# Patient Record
Sex: Male | Born: 1970
Health system: Southern US, Community
[De-identification: ages and names within clinical notes are randomized; demographics above are authoritative.]

## PROBLEM LIST (undated history)

## (undated) DIAGNOSIS — I10 Essential (primary) hypertension: Secondary | ICD-10-CM

## (undated) DIAGNOSIS — F419 Anxiety disorder, unspecified: Secondary | ICD-10-CM

## (undated) HISTORY — PX: TONSILLECTOMY: SUR1361

## (undated) HISTORY — PX: APPENDECTOMY: SHX54

---

## 2009-12-10 ENCOUNTER — Ambulatory Visit: Payer: Self-pay | Admitting: Cardiology

## 2009-12-10 ENCOUNTER — Observation Stay (HOSPITAL_COMMUNITY): Admission: EM | Admit: 2009-12-10 | Discharge: 2009-12-11 | Payer: Self-pay | Admitting: Emergency Medicine

## 2009-12-11 ENCOUNTER — Encounter (INDEPENDENT_AMBULATORY_CARE_PROVIDER_SITE_OTHER): Payer: Self-pay | Admitting: *Deleted

## 2009-12-11 LAB — CONVERTED CEMR LAB
ALT: 31 units/L
AST: 23 units/L
Albumin: 3.6 g/dL
Alkaline Phosphatase: 40 units/L
BUN: 18 mg/dL
Basophils Absolute: 0 10*3/uL
Basophils Relative: 1 %
CO2: 27 meq/L
Calcium: 8.9 mg/dL
Chloride: 104 meq/L
Creatinine, Ser: 1.08 mg/dL
Eosinophils Absolute: 0.2 10*3/uL
Eosinophils Relative: 2 %
GFR calc non Af Amer: >60 mL/min
Glomerular Filtration Rate, Af Am: >60 mL/min/{1.73_m2}
Glucose, Bld: 96 mg/dL
HCT: 39.8 %
Hemoglobin: 13.8 g/dL
Lymphocytes Relative: 38 %
Lymphs Abs: 2.5 10*3/uL
MCHC: 34.7 g/dL
MCV: 90.3 fL
Monocytes Absolute: 0.5 10*3/uL
Monocytes Relative: 7 %
Neutro Abs: 3.5 10*3/uL
Platelets: 263 10*3/uL
Potassium: 3.8 meq/L
RBC: 4.41 M/uL
RDW: 13 %
Sodium: 139 meq/L
Total Protein: 6.4 g/dL
WBC: 6.7 10*3/uL

## 2009-12-15 ENCOUNTER — Ambulatory Visit: Payer: Self-pay | Admitting: Cardiology

## 2009-12-15 ENCOUNTER — Ambulatory Visit (HOSPITAL_COMMUNITY): Admission: RE | Admit: 2009-12-15 | Discharge: 2009-12-15 | Payer: Self-pay | Admitting: Cardiology

## 2009-12-15 ENCOUNTER — Encounter: Payer: Self-pay | Admitting: Cardiology

## 2010-01-08 ENCOUNTER — Encounter (INDEPENDENT_AMBULATORY_CARE_PROVIDER_SITE_OTHER): Payer: Self-pay | Admitting: *Deleted

## 2010-01-15 ENCOUNTER — Encounter (INDEPENDENT_AMBULATORY_CARE_PROVIDER_SITE_OTHER): Payer: Self-pay | Admitting: *Deleted

## 2010-01-15 ENCOUNTER — Encounter: Payer: Self-pay | Admitting: Cardiology

## 2010-01-15 DIAGNOSIS — R079 Chest pain, unspecified: Secondary | ICD-10-CM | POA: Insufficient documentation

## 2010-01-15 DIAGNOSIS — E669 Obesity, unspecified: Secondary | ICD-10-CM | POA: Insufficient documentation

## 2010-01-15 DIAGNOSIS — K219 Gastro-esophageal reflux disease without esophagitis: Secondary | ICD-10-CM | POA: Insufficient documentation

## 2010-01-15 DIAGNOSIS — F1010 Alcohol abuse, uncomplicated: Secondary | ICD-10-CM | POA: Insufficient documentation

## 2010-01-15 DIAGNOSIS — F17200 Nicotine dependence, unspecified, uncomplicated: Secondary | ICD-10-CM | POA: Insufficient documentation

## 2010-04-14 NOTE — Miscellaneous (Signed)
Summary: NPH    Allergies: No Known Drug Allergies  Past History:  Past Medical History: Atypical chest pain-negative stress echocardiogram Alcohol abuse Hypertension Tobacco abuse Obesity Gastroesophageal reflux disease  Past Surgical History: Appendectomy Tonsillectomy Retrieval of ingested foreign body from stomach-2000  Family History: Mother-hypertension Father-hypertension No family history for premature coronary artery disease  Social History: Employment-truck Product manager Married; one child Tobacco Use - 15 pack years Alcohol Use - 6+ ounces per day Regular Exercise - no Drug Use - no

## 2010-04-14 NOTE — Letter (Signed)
Summary: Appointment - Missed  [facility] [place]  [address] [place] [address]   Phone: [phone]  Fax: [phone]     January 15, 2010 MRN: [id number]   [facility] [address] [place], [place]  [address]   Dear Mr. Strickling,  Our records indicate you missed your appointment on     01/15/10                   with Dr.       .      ROTHBART                             It is very important that we reach you to reschedule this appointment. We look forward to participating in your health care needs. Please contact us at the number listed above at your earliest convenience to reschedule this appointment.     Sincerely,    Glass blower/designer

## 2010-04-14 NOTE — Miscellaneous (Signed)
Summary: labs hospital 12/11/2009  Clinical Lists Changes  Observations: Added new observation of CALCIUM: 8.9 mg/dL (11/91/4782 95:62) Added new observation of ALBUMIN: 3.6 g/dL (13/10/6576 46:96) Added new observation of PROTEIN, TOT: 6.4 g/dL (29/52/8413 24:40) Added new observation of SGPT (ALT): 31 units/L (12/11/2009 11:00) Added new observation of SGOT (AST): 23 units/L (12/11/2009 11:00) Added new observation of ALK PHOS: 40 units/L (12/11/2009 11:00) Added new observation of GFR AA: >60 mL/min/1.6m2 (12/11/2009 11:00) Added new observation of GFR: >60 mL/min (12/11/2009 11:00) Added new observation of CREATININE: 1.08 mg/dL (01/09/2535 64:40) Added new observation of BUN: 18 mg/dL (34/74/2595 63:87) Added new observation of BG RANDOM: 96 mg/dL (56/43/3295 18:84) Added new observation of CO2 PLSM/SER: 27 meq/L (12/11/2009 11:00) Added new observation of CL SERUM: 104 meq/L (12/11/2009 11:00) Added new observation of K SERUM: 3.8 meq/L (12/11/2009 11:00) Added new observation of NA: 139 meq/L (12/11/2009 11:00) Added new observation of ABSOLUTE BAS: 0.0 K/uL (12/11/2009 11:00) Added new observation of BASOPHIL %: 1 % (12/11/2009 11:00) Added new observation of EOS ABSLT: 0.2 K/uL (12/11/2009 11:00) Added new observation of % EOS AUTO: 2 % (12/11/2009 11:00) Added new observation of ABSOLUTE MON: 0.5 K/uL (12/11/2009 11:00) Added new observation of MONOCYTE %: 7 % (12/11/2009 11:00) Added new observation of ABS LYMPHOCY: 2.5 K/uL (12/11/2009 11:00) Added new observation of LYMPHS %: 38 % (12/11/2009 11:00) Added new observation of ABS NEUTROPH: 3.5 K/uL (12/11/2009 11:00) Added new observation of PLATELETK/UL: 263 K/uL (12/11/2009 11:00) Added new observation of RDW: 13.0 % (12/11/2009 11:00) Added new observation of MCHC RBC: 34.7 g/dL (16/60/6301 60:10) Added new observation of MCV: 90.3 fL (12/11/2009 11:00) Added new observation of HCT: 39.8 % (12/11/2009 11:00) Added new  observation of HGB: 13.8 g/dL (93/23/5573 22:02) Added new observation of RBC M/UL: 4.41 M/uL (12/11/2009 11:00) Added new observation of WBC COUNT: 6.7 10*3/microliter (12/11/2009 11:00)

## 2010-05-28 LAB — COMPREHENSIVE METABOLIC PANEL
ALT: 31 U/L (ref 0–53)
AST: 23 U/L (ref 0–37)
Albumin: 3.6 g/dL (ref 3.5–5.2)
Alkaline Phosphatase: 40 U/L (ref 39–117)
BUN: 18 mg/dL (ref 6–23)
CO2: 27 mEq/L (ref 19–32)
Calcium: 8.9 mg/dL (ref 8.4–10.5)
Chloride: 104 mEq/L (ref 96–112)
Creatinine, Ser: 1.08 mg/dL (ref 0.4–1.5)
GFR calc Af Amer: >60 mL/min (ref >60)
GFR calc non Af Amer: >60 mL/min (ref >60)
Glucose, Bld: 96 mg/dL (ref 70–99)
Potassium: 3.8 mEq/L (ref 3.5–5.1)
Sodium: 139 mEq/L (ref 135–145)
Total Bilirubin: 0.5 mg/dL (ref 0.3–1.2)
Total Protein: 6.4 g/dL (ref 6.0–8.3)

## 2010-05-28 LAB — LIPID PANEL
Cholesterol: 189 mg/dL (ref 0–200)
HDL: 30 mg/dL — ABNORMAL LOW (ref >39)
LDL Cholesterol: 111 mg/dL — ABNORMAL HIGH (ref 0–99)
Total CHOL/HDL Ratio: 6.3 RATIO
Triglycerides: 241 mg/dL — ABNORMAL HIGH (ref <150)
VLDL: 48 mg/dL — ABNORMAL HIGH (ref 0–40)

## 2010-05-28 LAB — DIFFERENTIAL
Basophils Absolute: 0 10*3/uL (ref 0.0–0.1)
Basophils Absolute: 0 10*3/uL (ref 0.0–0.1)
Basophils Relative: 1 % (ref 0–1)
Basophils Relative: 1 % (ref 0–1)
Eosinophils Absolute: 0.1 10*3/uL (ref 0.0–0.7)
Eosinophils Absolute: 0.2 10*3/uL (ref 0.0–0.7)
Eosinophils Relative: 2 % (ref 0–5)
Eosinophils Relative: 2 % (ref 0–5)
Lymphocytes Relative: 32 % (ref 12–46)
Lymphocytes Relative: 38 % (ref 12–46)
Lymphs Abs: 1.8 10*3/uL (ref 0.7–4.0)
Lymphs Abs: 2.5 10*3/uL (ref 0.7–4.0)
Monocytes Absolute: 0.4 10*3/uL (ref 0.1–1.0)
Monocytes Absolute: 0.5 10*3/uL (ref 0.1–1.0)
Monocytes Relative: 7 % (ref 3–12)
Monocytes Relative: 7 % (ref 3–12)
Neutro Abs: 3.3 10*3/uL (ref 1.7–7.7)
Neutro Abs: 3.5 10*3/uL (ref 1.7–7.7)
Neutrophils Relative %: 52 % (ref 43–77)
Neutrophils Relative %: 58 % (ref 43–77)

## 2010-05-28 LAB — CBC
HCT: 39.8 % (ref 39.0–52.0)
HCT: 41.5 % (ref 39.0–52.0)
Hemoglobin: 13.8 g/dL (ref 13.0–17.0)
Hemoglobin: 14.5 g/dL (ref 13.0–17.0)
MCH: 31.3 pg (ref 26.0–34.0)
MCH: 31.5 pg (ref 26.0–34.0)
MCHC: 34.7 g/dL (ref 30.0–36.0)
MCHC: 35 g/dL (ref 30.0–36.0)
MCV: 89.9 fL (ref 78.0–100.0)
MCV: 90.3 fL (ref 78.0–100.0)
Platelets: 263 10*3/uL (ref 150–400)
Platelets: 272 10*3/uL (ref 150–400)
RBC: 4.41 MIL/uL (ref 4.22–5.81)
RBC: 4.61 MIL/uL (ref 4.22–5.81)
RDW: 13 % (ref 11.5–15.5)
RDW: 13.2 % (ref 11.5–15.5)
WBC: 5.7 10*3/uL (ref 4.0–10.5)
WBC: 6.7 10*3/uL (ref 4.0–10.5)

## 2010-05-28 LAB — CARDIAC PANEL(CRET KIN+CKTOT+MB+TROPI)
CK, MB: 0.4 ng/mL (ref 0.3–4.0)
CK, MB: 0.5 ng/mL (ref 0.3–4.0)
CK, MB: 0.6 ng/mL (ref 0.3–4.0)
Relative Index: INVALID (ref 0.0–2.5)
Relative Index: INVALID (ref 0.0–2.5)
Relative Index: INVALID (ref 0.0–2.5)
Total CK: 34 U/L (ref 7–232)
Total CK: 36 U/L (ref 7–232)
Total CK: 37 U/L (ref 7–232)
Troponin I: 0.01 ng/mL (ref 0.00–0.06)
Troponin I: <0.01 ng/mL (ref 0.00–0.06)
Troponin I: <0.01 ng/mL (ref 0.00–0.06)

## 2010-05-28 LAB — POCT CARDIAC MARKERS
CKMB, poc: <1 ng/mL — ABNORMAL LOW (ref 1.0–8.0)
CKMB, poc: <1 ng/mL — ABNORMAL LOW (ref 1.0–8.0)
Myoglobin, poc: 30.9 ng/mL (ref 12–200)
Myoglobin, poc: 32 ng/mL (ref 12–200)
Troponin i, poc: <0.05 ng/mL (ref 0.00–0.09)
Troponin i, poc: <0.05 ng/mL (ref 0.00–0.09)

## 2010-05-28 LAB — BASIC METABOLIC PANEL
BUN: 16 mg/dL (ref 6–23)
CO2: 28 mEq/L (ref 19–32)
Calcium: 9.1 mg/dL (ref 8.4–10.5)
Chloride: 103 mEq/L (ref 96–112)
Creatinine, Ser: 0.98 mg/dL (ref 0.4–1.5)
GFR calc Af Amer: >60 mL/min (ref >60)
GFR calc non Af Amer: >60 mL/min (ref >60)
Glucose, Bld: 94 mg/dL (ref 70–99)
Potassium: 4 mEq/L (ref 3.5–5.1)
Sodium: 137 mEq/L (ref 135–145)

## 2010-05-28 LAB — D-DIMER, QUANTITATIVE: D-Dimer, Quant: 0.26 ug/mL-FEU (ref 0.00–0.48)

## 2010-05-28 LAB — APTT: aPTT: 37 seconds (ref 24–37)

## 2010-05-28 LAB — BRAIN NATRIURETIC PEPTIDE: Pro B Natriuretic peptide (BNP): <30 pg/mL (ref 0.0–100.0)

## 2010-05-28 LAB — PROTIME-INR
INR: 0.99 (ref 0.00–1.49)
Prothrombin Time: 13.3 seconds (ref 11.6–15.2)

## 2010-05-28 LAB — TSH: TSH: 1.071 u[IU]/mL (ref 0.350–4.500)

## 2011-11-24 ENCOUNTER — Other Ambulatory Visit (HOSPITAL_COMMUNITY): Payer: Self-pay | Admitting: Internal Medicine

## 2011-11-24 DIAGNOSIS — M5430 Sciatica, unspecified side: Secondary | ICD-10-CM

## 2011-11-24 DIAGNOSIS — M545 Low back pain, unspecified: Secondary | ICD-10-CM

## 2011-11-26 ENCOUNTER — Inpatient Hospital Stay (HOSPITAL_COMMUNITY): Admission: RE | Admit: 2011-11-26 | Payer: Self-pay | Source: Ambulatory Visit

## 2013-07-17 ENCOUNTER — Other Ambulatory Visit: Payer: Self-pay | Admitting: Internal Medicine

## 2016-05-12 DIAGNOSIS — I10 Essential (primary) hypertension: Secondary | ICD-10-CM | POA: Diagnosis not present

## 2016-05-12 DIAGNOSIS — F419 Anxiety disorder, unspecified: Secondary | ICD-10-CM | POA: Diagnosis not present

## 2016-05-12 DIAGNOSIS — R55 Syncope and collapse: Secondary | ICD-10-CM | POA: Diagnosis not present

## 2016-05-12 DIAGNOSIS — Z1389 Encounter for screening for other disorder: Secondary | ICD-10-CM | POA: Diagnosis not present

## 2016-05-12 DIAGNOSIS — Z6825 Body mass index (BMI) 25.0-25.9, adult: Secondary | ICD-10-CM | POA: Diagnosis not present

## 2016-08-05 DIAGNOSIS — Z0000 Encounter for general adult medical examination without abnormal findings: Secondary | ICD-10-CM | POA: Diagnosis not present

## 2016-12-07 DIAGNOSIS — Z1389 Encounter for screening for other disorder: Secondary | ICD-10-CM | POA: Diagnosis not present

## 2016-12-07 DIAGNOSIS — Z6825 Body mass index (BMI) 25.0-25.9, adult: Secondary | ICD-10-CM | POA: Diagnosis not present

## 2016-12-07 DIAGNOSIS — J302 Other seasonal allergic rhinitis: Secondary | ICD-10-CM | POA: Diagnosis not present

## 2016-12-07 DIAGNOSIS — E782 Mixed hyperlipidemia: Secondary | ICD-10-CM | POA: Diagnosis not present

## 2016-12-07 DIAGNOSIS — R5383 Other fatigue: Secondary | ICD-10-CM | POA: Diagnosis not present

## 2016-12-07 DIAGNOSIS — I10 Essential (primary) hypertension: Secondary | ICD-10-CM | POA: Diagnosis not present

## 2017-06-10 DIAGNOSIS — F419 Anxiety disorder, unspecified: Secondary | ICD-10-CM | POA: Diagnosis not present

## 2017-06-10 DIAGNOSIS — K069 Disorder of gingiva and edentulous alveolar ridge, unspecified: Secondary | ICD-10-CM | POA: Diagnosis not present

## 2017-06-10 DIAGNOSIS — K056 Periodontal disease, unspecified: Secondary | ICD-10-CM | POA: Diagnosis not present

## 2017-06-10 DIAGNOSIS — H539 Unspecified visual disturbance: Secondary | ICD-10-CM | POA: Diagnosis not present

## 2017-06-10 DIAGNOSIS — Z6826 Body mass index (BMI) 26.0-26.9, adult: Secondary | ICD-10-CM | POA: Diagnosis not present

## 2017-06-10 DIAGNOSIS — Z1389 Encounter for screening for other disorder: Secondary | ICD-10-CM | POA: Diagnosis not present

## 2017-06-10 DIAGNOSIS — E663 Overweight: Secondary | ICD-10-CM | POA: Diagnosis not present

## 2017-07-21 DIAGNOSIS — Z0000 Encounter for general adult medical examination without abnormal findings: Secondary | ICD-10-CM | POA: Diagnosis not present

## 2017-10-13 DIAGNOSIS — Z713 Dietary counseling and surveillance: Secondary | ICD-10-CM | POA: Diagnosis not present

## 2017-11-10 DIAGNOSIS — E663 Overweight: Secondary | ICD-10-CM | POA: Diagnosis not present

## 2017-11-10 DIAGNOSIS — Z6826 Body mass index (BMI) 26.0-26.9, adult: Secondary | ICD-10-CM | POA: Diagnosis not present

## 2017-11-10 DIAGNOSIS — E782 Mixed hyperlipidemia: Secondary | ICD-10-CM | POA: Diagnosis not present

## 2017-11-10 DIAGNOSIS — Z719 Counseling, unspecified: Secondary | ICD-10-CM | POA: Diagnosis not present

## 2017-11-10 DIAGNOSIS — Z1389 Encounter for screening for other disorder: Secondary | ICD-10-CM | POA: Diagnosis not present

## 2017-11-10 DIAGNOSIS — Z0001 Encounter for general adult medical examination with abnormal findings: Secondary | ICD-10-CM | POA: Diagnosis not present

## 2017-12-21 DIAGNOSIS — Z713 Dietary counseling and surveillance: Secondary | ICD-10-CM | POA: Diagnosis not present

## 2018-05-04 DIAGNOSIS — Z713 Dietary counseling and surveillance: Secondary | ICD-10-CM | POA: Diagnosis not present

## 2018-05-04 DIAGNOSIS — Z13220 Encounter for screening for lipoid disorders: Secondary | ICD-10-CM | POA: Diagnosis not present

## 2018-05-23 DIAGNOSIS — Z1389 Encounter for screening for other disorder: Secondary | ICD-10-CM | POA: Diagnosis not present

## 2018-05-23 DIAGNOSIS — I10 Essential (primary) hypertension: Secondary | ICD-10-CM | POA: Diagnosis not present

## 2018-05-23 DIAGNOSIS — E7849 Other hyperlipidemia: Secondary | ICD-10-CM | POA: Diagnosis not present

## 2018-05-23 DIAGNOSIS — E663 Overweight: Secondary | ICD-10-CM | POA: Diagnosis not present

## 2018-05-23 DIAGNOSIS — Z6827 Body mass index (BMI) 27.0-27.9, adult: Secondary | ICD-10-CM | POA: Diagnosis not present

## 2018-06-07 DIAGNOSIS — I10 Essential (primary) hypertension: Secondary | ICD-10-CM | POA: Diagnosis not present

## 2018-06-07 DIAGNOSIS — E663 Overweight: Secondary | ICD-10-CM | POA: Diagnosis not present

## 2018-06-07 DIAGNOSIS — Z1389 Encounter for screening for other disorder: Secondary | ICD-10-CM | POA: Diagnosis not present

## 2018-06-07 DIAGNOSIS — Z6827 Body mass index (BMI) 27.0-27.9, adult: Secondary | ICD-10-CM | POA: Diagnosis not present

## 2018-08-21 ENCOUNTER — Emergency Department (HOSPITAL_COMMUNITY)
Admission: EM | Admit: 2018-08-21 | Discharge: 2018-08-21 | Disposition: A | Discharge status: Home / Self Care | Payer: BC Managed Care – PPO | Attending: Emergency Medicine | Admitting: Emergency Medicine

## 2018-08-21 ENCOUNTER — Other Ambulatory Visit: Payer: Self-pay

## 2018-08-21 ENCOUNTER — Encounter (HOSPITAL_COMMUNITY): Payer: Self-pay | Admitting: Emergency Medicine

## 2018-08-21 ENCOUNTER — Emergency Department (HOSPITAL_COMMUNITY): Payer: BC Managed Care – PPO

## 2018-08-21 DIAGNOSIS — K429 Umbilical hernia without obstruction or gangrene: Secondary | ICD-10-CM | POA: Diagnosis not present

## 2018-08-21 DIAGNOSIS — I10 Essential (primary) hypertension: Secondary | ICD-10-CM | POA: Diagnosis not present

## 2018-08-21 DIAGNOSIS — F17210 Nicotine dependence, cigarettes, uncomplicated: Secondary | ICD-10-CM | POA: Insufficient documentation

## 2018-08-21 DIAGNOSIS — R1084 Generalized abdominal pain: Secondary | ICD-10-CM | POA: Insufficient documentation

## 2018-08-21 DIAGNOSIS — R0789 Other chest pain: Secondary | ICD-10-CM | POA: Diagnosis not present

## 2018-08-21 DIAGNOSIS — R079 Chest pain, unspecified: Secondary | ICD-10-CM | POA: Diagnosis not present

## 2018-08-21 DIAGNOSIS — R9431 Abnormal electrocardiogram [ECG] [EKG]: Secondary | ICD-10-CM | POA: Diagnosis not present

## 2018-08-21 HISTORY — DX: Essential (primary) hypertension: I10

## 2018-08-21 LAB — BASIC METABOLIC PANEL
Anion gap: 13 (ref 5–15)
BUN: 11 mg/dL (ref 6–20)
CO2: 21 mmol/L — ABNORMAL LOW (ref 22–32)
Calcium: 9.2 mg/dL (ref 8.9–10.3)
Chloride: 102 mmol/L (ref 98–111)
Creatinine, Ser: 1.06 mg/dL (ref 0.61–1.24)
GFR calc Af Amer: >60 mL/min (ref >60)
GFR calc non Af Amer: >60 mL/min (ref >60)
Glucose, Bld: 93 mg/dL (ref 70–99)
Potassium: 4.3 mmol/L (ref 3.5–5.1)
Sodium: 136 mmol/L (ref 135–145)

## 2018-08-21 LAB — LIPASE, BLOOD: Lipase: 55 U/L — ABNORMAL HIGH (ref 11–51)

## 2018-08-21 LAB — CBC
HCT: 48.3 % (ref 39.0–52.0)
Hemoglobin: 16.2 g/dL (ref 13.0–17.0)
MCH: 30.7 pg (ref 26.0–34.0)
MCHC: 33.5 g/dL (ref 30.0–36.0)
MCV: 91.7 fL (ref 80.0–100.0)
Platelets: 329 10*3/uL (ref 150–400)
RBC: 5.27 MIL/uL (ref 4.22–5.81)
RDW: 13.5 % (ref 11.5–15.5)
WBC: 11.1 10*3/uL — ABNORMAL HIGH (ref 4.0–10.5)
nRBC: 0 % (ref 0.0–0.2)

## 2018-08-21 LAB — TROPONIN I: Troponin I: <0.03 ng/mL (ref <0.03)

## 2018-08-21 MED ORDER — SODIUM CHLORIDE 0.9% FLUSH: 3.0000 mL | Freq: Once | INTRAVENOUS | Status: DC

## 2018-08-21 MED ORDER — IOHEXOL 300 MG/ML  SOLN
100.0000 mL | Freq: Once | INTRAMUSCULAR | Status: AC | PRN
  Administered 2018-08-21: 18:00:00 100 mL via INTRAVENOUS

## 2018-08-21 MED ORDER — SODIUM CHLORIDE 0.9 % IV BOLUS
1000.0000 mL | Freq: Once | INTRAVENOUS | Status: AC
  Administered 2018-08-21: 1000 mL via INTRAVENOUS

## 2018-08-21 NOTE — ED Provider Notes (Signed)
[facility] EMERGENCY DEPARTMENT Provider Note   CSN: [id number] Arrival date & time: 08/21/18  1543    History   Chief Complaint Chief Complaint  Patient presents with   Chest Pain    HPI Derrick RavelDaniel R Knoebel is a 48 y.o. male.     Pt presents to the ED today with cp.  Pt said he works outside and had been outside all day.  He has been drinking fluids.  He said the pain lasted for a few minutes and caused his hands to get numb.  He feels ok now.  He denies any sob.  No fevers or cough.     Past Medical History:  Diagnosis Date   Hypertension     Patient Active Problem List   Diagnosis Date Noted   OBESITY 01/15/2010   ALCOHOL ABUSE 01/15/2010   TOBACCO ABUSE 01/15/2010   GASTROESOPHAGEAL REFLUX DISEASE 01/15/2010   CHEST PAIN 01/15/2010    History reviewed. No pertinent surgical history.      Home Medications    Prior to Admission medications   Medication Sig Start Date End Date Taking? Authorizing Provider  losartan (COZAAR) 100 MG tablet Take 100 mg by mouth daily.  02/21/14  Yes [provider]  metoprolol succinate (TOPROL-XL) 100 MG 24 hr tablet TAKE 1 TABLET BY MOUTH EVERY DAY 07/17/13   Cliffton Astersampbell, John, MD    Family History No family history on file.  Social History Social History   Tobacco Use   Smoking status: Current Some Day Smoker   Smokeless tobacco: Never Used  Substance Use Topics   Alcohol use: Yes   Drug use: Yes    Types: Marijuana     Allergies   Patient has no known allergies.   Review of Systems Review of Systems  Cardiovascular: Positive for chest pain.  All other systems reviewed and are negative.    Physical Exam Updated Vital Signs BP (!) 143/90 (BP Location: Right Arm)    Pulse 94    Temp 98.1 F (36.7 C) (Oral)    Resp 14    Ht 5\' 10"  (1.778 m)    Wt 80.7 kg    SpO2 98%    BMI 25.54 kg/m   Physical Exam Vitals signs and nursing note reviewed.  Constitutional:      Appearance: He is  well-developed.  HENT:     Head: Normocephalic and atraumatic.  Eyes:     Extraocular Movements: Extraocular movements intact.     Pupils: Pupils are equal, round, and reactive to light.  Neck:     Musculoskeletal: Normal range of motion and neck supple.  Cardiovascular:     Rate and Rhythm: Normal rate and regular rhythm.     Heart sounds: Normal heart sounds.  Pulmonary:     Effort: Pulmonary effort is normal.     Breath sounds: Normal breath sounds.  Abdominal:     General: Bowel sounds are normal.     Palpations: Abdomen is soft.  Musculoskeletal: Normal range of motion.  Skin:    General: Skin is warm and dry.     Capillary Refill: Capillary refill takes less than 2 seconds.  Neurological:     General: No focal deficit present.     Mental Status: He is alert and oriented to person, place, and time.  Psychiatric:        Mood and Affect: Mood normal.        Behavior: Behavior normal.      ED Treatments /  Results  Labs (all labs ordered are listed, but only abnormal results are displayed) Labs Reviewed  BASIC METABOLIC PANEL - Abnormal; Notable for the following components:      Result Value   CO2 21 (*)    All other components within normal limits  CBC - Abnormal; Notable for the following components:   WBC 11.1 (*)    All other components within normal limits  LIPASE, BLOOD - Abnormal; Notable for the following components:   Lipase 55 (*)    All other components within normal limits  TROPONIN I    EKG EKG Interpretation  Date/Time:  Monday August 21 2018 15:55:33 EDT Ventricular Rate:  99 PR Interval:  144 QRS Duration: 92 QT Interval:  340 QTC Calculation: 436 R Axis:   70 Text Interpretation:  Normal sinus rhythm Incomplete right bundle branch block Possible Anterior infarct , age undetermined Abnormal ECG Since last tracing rate faster Confirmed by Isla Pence [phone]) on 08/21/2018 4:12:06 PM   Radiology Dg Chest 2 View  Result Date:  08/21/2018 CLINICAL DATA:  48 year old male with history of chest pain radiating to the mid back. EXAM: CHEST - 2 VIEW COMPARISON:  Chest x-ray 12/10/2009. FINDINGS: Lung volumes are normal. No consolidative airspace disease. No pleural effusions. No pneumothorax. No pulmonary nodule or mass noted. Pulmonary vasculature and the cardiomediastinal silhouette are within normal limits. IMPRESSION: No radiographic evidence of acute cardiopulmonary disease. Electronically Signed   By: Vinnie Langton M.D.   On: 08/21/2018 16:48   Ct Abdomen Pelvis W Contrast  Result Date: 08/21/2018 CLINICAL DATA:  Chest and mid back pain EXAM: CT ABDOMEN AND PELVIS WITH CONTRAST TECHNIQUE: Multidetector CT imaging of the abdomen and pelvis was performed using the standard protocol following bolus administration of intravenous contrast. CONTRAST:  116mL OMNIPAQUE IOHEXOL 300 MG/ML  SOLN COMPARISON:  None. FINDINGS: Lower chest: The heart size is mildly enlarged. The lung bases are clear. Hepatobiliary: There is a patent steatosis. The gallbladder is unremarkable by CT standards. Pancreas: Unremarkable. No pancreatic ductal dilatation or surrounding inflammatory changes. Spleen: Normal in size without focal abnormality. Adrenals/Urinary Tract: Adrenal glands are unremarkable. Kidneys are normal, without renal calculi, focal lesion, or hydronephrosis. Bladder is unremarkable. Stomach/Bowel: Stomach is within normal limits. The appendix is not reliably identified. No evidence of bowel wall thickening, distention, or inflammatory changes. Vascular/Lymphatic: No significant vascular findings are present. No enlarged abdominal or pelvic lymph nodes. Reproductive: Prostate is unremarkable. Other: There are a few fat containing periumbilical hernias. There is no significant free fluid. Musculoskeletal: No acute or significant osseous findings. IMPRESSION: 1. No acute intra-abdominal abnormality detected. 2. Probable underlying hepatic  steatosis. 3. A few fat containing periumbilical hernias are noted. Electronically Signed   By: Constance Holster M.D.   On: 08/21/2018 18:58    Procedures Procedures (including critical care time)  Medications Ordered in ED Medications  sodium chloride flush (NS) 0.9 % injection 3 mL (has no administration in time range)  sodium chloride 0.9 % bolus 1,000 mL (1,000 mLs Intravenous New Bag/Given 08/21/18 1736)  iohexol (OMNIPAQUE) 300 MG/ML solution 100 mL (100 mLs Intravenous Contrast Given 08/21/18 1757)     Initial Impression / Assessment and Plan / ED Course  I have reviewed the triage vital signs and the nursing notes.  Pertinent labs & imaging results that were available during my care of the patient were reviewed by me and considered in my medical decision making (see chart for details).  Work up is unremarkable.  CP only for a few minutes.  Neg EKG and trop.  Abd/pelvis CT without anything acute.  Pt encouraged to drink lots of fluids if working outside.  Return if worse.  F/u with pcp.  Final Clinical Impressions(s) / ED Diagnoses   Final diagnoses:  Atypical chest pain  Generalized abdominal pain    ED Discharge Orders    None       Jacalyn LefevreHaviland, Param Capri, MD 08/21/18 1904

## 2018-08-21 NOTE — ED Triage Notes (Signed)
Pt C/o chest pain that radiates to the mid back. Pt states the pain started in his abdomen and "moved to his chest." Pt also reports tingling in hands and arms.

## 2018-08-29 DIAGNOSIS — R0789 Other chest pain: Secondary | ICD-10-CM | POA: Diagnosis not present

## 2018-08-29 DIAGNOSIS — Z719 Counseling, unspecified: Secondary | ICD-10-CM | POA: Diagnosis not present

## 2018-08-29 DIAGNOSIS — Z6826 Body mass index (BMI) 26.0-26.9, adult: Secondary | ICD-10-CM | POA: Diagnosis not present

## 2018-08-29 DIAGNOSIS — Z1389 Encounter for screening for other disorder: Secondary | ICD-10-CM | POA: Diagnosis not present

## 2018-08-29 DIAGNOSIS — E663 Overweight: Secondary | ICD-10-CM | POA: Diagnosis not present

## 2018-10-17 DIAGNOSIS — Z0000 Encounter for general adult medical examination without abnormal findings: Secondary | ICD-10-CM | POA: Diagnosis not present

## 2018-11-14 ENCOUNTER — Other Ambulatory Visit: Payer: Self-pay

## 2018-11-14 ENCOUNTER — Encounter: Payer: Self-pay | Admitting: Cardiovascular Disease

## 2018-11-14 ENCOUNTER — Ambulatory Visit (INDEPENDENT_AMBULATORY_CARE_PROVIDER_SITE_OTHER): Payer: BC Managed Care – PPO | Admitting: Cardiovascular Disease

## 2018-11-14 VITALS — BP 158/88 | HR 89 | Temp 79.2°F | Ht 70.0 in | Wt 175.0 lb

## 2018-11-14 DIAGNOSIS — I10 Essential (primary) hypertension: Secondary | ICD-10-CM | POA: Diagnosis not present

## 2018-11-14 DIAGNOSIS — R079 Chest pain, unspecified: Secondary | ICD-10-CM | POA: Diagnosis not present

## 2018-11-14 NOTE — Patient Instructions (Signed)
Medication Instructions: Your physician recommends that you continue on your current medications as directed. Please refer to the Current Medication list given to you today.   Labwork: None  Procedures/Testing: None  Follow-Up: As needed with Dr.koneswaran  Any Additional Special Instructions Will Be Listed Below (If Applicable).    Thank you for choosing [facility] !

## 2018-11-14 NOTE — Progress Notes (Signed)
CARDIOLOGY CONSULT NOTE  Patient ID: Wesley Dunn MRN: [id number] DOB/AGE: 1970/08/12 48 y.o.  Admit date: (Not on file) Primary Physician: Sharilyn Sites, MD  Reason for Consultation: Chest pain  HPI: Wesley Dunn is a 48 y.o. male who is being seen today for the evaluation of chest pain at the request of Sharilyn Sites, MD.   He was evaluated for ED in the chest pain on 08/21/2018.  He had some associated bilateral hand numbness.  There is no associated shortness of breath, fevers, or cough.  He had been working outside all day and it was felt that he had atypical chest pain from relative dehydration.  White blood cells were mildly elevated at 11.1.  Troponins were normal.  Renal function was also normal.  Chest x-ray showed no acute disease.  He had some abdominal pain and a CT abdomen was performed which showed no acute abnormalities.  There was probable hepatic steatosis.  I personally reviewed ECG which demonstrated sinus rhythm, 99 bpm, RSR prime pattern, and late R wave transition.  Since the episode in early June he has had no further episodes of chest pain.  He also denies shortness of breath, palpitations, leg swelling, orthopnea, paroxysmal nocturnal dyspnea.  He has occasional dizziness but denies syncope.  He checks his blood pressure regularly and said it generally runs in the 130/80 range.  He said he gets very nervous when coming to a hospital or doctors offices.  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  He is married.  He has a 33 year old son and a 2 year old daughter.  He used to be an alcoholic and drinks 4-5 cases of beer per week and now drinks 12 cans of beer per week at a maximum and wants to quit altogether.  He smokes about 6 cigars a day and used to smoke 12 cigars a day.  He requests a letter to be off from work today.   No Known Allergies  Current Outpatient Medications  Medication Sig Dispense Refill  . losartan (COZAAR) 100 MG  tablet Take 100 mg by mouth daily.      No current facility-administered medications for this visit.     Past Medical History:  Diagnosis Date  . Hypertension     History reviewed. No pertinent surgical history.  Social History   Socioeconomic History  . Marital status: Married    Spouse name: Not on file  . Number of children: Not on file  . Years of education: Not on file  . Highest education level: Not on file  Occupational History  . Not on file  Social Needs  . Financial resource strain: Not on file  . Food insecurity    Worry: Not on file    Inability: Not on file  . Transportation needs    Medical: Not on file    Non-medical: Not on file  Tobacco Use  . Smoking status: Current Some Day Smoker    Types: Cigars  . Smokeless tobacco: Never Used  . Tobacco comment: 6 per day   Substance and Sexual Activity  . Alcohol use: Yes    Alcohol/week: 3.0 standard drinks    Types: 3 Cans of beer per week    Comment: depends/ cut way back   . Drug use: Yes    Types: Marijuana  . Sexual activity: Not on file  Lifestyle  . Physical activity    Days per week: Not on file  Minutes per session: Not on file  . Stress: Not on file  Relationships  . Social Musicianconnections    Talks on phone: Not on file    Gets together: Not on file    Attends religious service: Not on file    Active member of club or organization: Not on file    Attends meetings of clubs or organizations: Not on file    Relationship status: Not on file  . Intimate partner violence    Fear of current or ex partner: Not on file    Emotionally abused: Not on file    Physically abused: Not on file    Forced sexual activity: Not on file  Other Topics Concern  . Not on file  Social History Narrative  . Not on file     No family history of premature CAD in 1st degree relatives.  Current Meds  Medication Sig  . losartan (COZAAR) 100 MG tablet Take 100 mg by mouth daily.   . [DISCONTINUED] metoprolol  succinate (TOPROL-XL) 100 MG 24 hr tablet TAKE 1 TABLET BY MOUTH EVERY DAY      Review of systems complete and found to be negative unless listed above in HPI    Physical exam Blood pressure (!) 158/88, pulse 89, temperature (!) 79.2 F (26.2 C), height 5\' 10"  (1.778 m), weight 175 lb (79.4 kg), SpO2 98 %. General: NAD Neck: No JVD, no thyromegaly or thyroid nodule.  Lungs: Clear to auscultation bilaterally with normal respiratory effort. CV: Nondisplaced PMI. Regular rate and rhythm, normal S1/S2, no S3/S4, no murmur.  No peripheral edema.  No carotid bruit.    Abdomen: Soft, nontender, no distention.  Skin: Intact without lesions or rashes.  Neurologic: Alert and oriented x 3.  Psych: Normal affect. Extremities: No clubbing or cyanosis.  HEENT: Normal.   ECG: Most recent ECG reviewed.   Labs: Lab Results  Component Value Date/Time   K 4.3 08/21/2018 04:16 PM   BUN 11 08/21/2018 04:16 PM   CREATININE 1.06 08/21/2018 04:16 PM   ALT 31 12/11/2009 05:04 AM   TSH 1.071 12/10/2009 09:31 PM   HGB 16.2 08/21/2018 04:16 PM     Lipids: Lab Results  Component Value Date/Time   LDLCALC (H) 12/11/2009 04:59 AM    111        Total Cholesterol/HDL:CHD Risk Coronary Heart Disease Risk Table                     Men   Women  1/2 Average Risk   3.4   3.3  Average Risk       5.0   4.4  2 X Average Risk   9.6   7.1  3 X Average Risk  23.4   11.0        Use the calculated Patient Ratio above and the CHD Risk Table to determine the patient's CHD Risk.        ATP III CLASSIFICATION (LDL):  <100     mg/dL   Optimal  161-096100-129  mg/dL   Near or Above                    Optimal  130-159  mg/dL   Borderline  045-409160-189  mg/dL   High  >811>190     mg/dL   Very High   CHOL  91/47/829509/29/2011 04:59 AM    189        ATP III CLASSIFICATION:  <200  mg/dL   Desirable  616-837  mg/dL   Borderline High  >=290    mg/dL   High          TRIG 211 (H) 12/11/2009 04:59 AM   HDL 30 (L) 12/11/2009 04:59  AM        ASSESSMENT AND PLAN:  1.  Chest pain: He has had no symptom recurrence since early June.  He denies exertional fatigue.  We talked about lifestyle modification.  No cardiac testing is indicated at this time.  2.  Hypertension: Blood pressure is elevated.  Currently on losartan 100 mg daily.  He checks it routinely at home and it runs in the 130/80 range.  He also admitted to whitecoat hypertension.     Disposition: Follow up as needed.  I will provide him with a letter to excuse him from work today.  Signed: Prentice Docker, M.D., F.A.C.C.  11/14/2018, 8:46 AM

## 2019-01-23 DIAGNOSIS — Z23 Encounter for immunization: Secondary | ICD-10-CM | POA: Diagnosis not present

## 2019-01-23 DIAGNOSIS — E663 Overweight: Secondary | ICD-10-CM | POA: Diagnosis not present

## 2019-01-23 DIAGNOSIS — K4090 Unilateral inguinal hernia, without obstruction or gangrene, not specified as recurrent: Secondary | ICD-10-CM | POA: Diagnosis not present

## 2019-01-23 DIAGNOSIS — Z6826 Body mass index (BMI) 26.0-26.9, adult: Secondary | ICD-10-CM | POA: Diagnosis not present

## 2019-05-01 DIAGNOSIS — Z6826 Body mass index (BMI) 26.0-26.9, adult: Secondary | ICD-10-CM | POA: Diagnosis not present

## 2019-05-01 DIAGNOSIS — S86911A Strain of unspecified muscle(s) and tendon(s) at lower leg level, right leg, initial encounter: Secondary | ICD-10-CM | POA: Diagnosis not present

## 2019-05-01 DIAGNOSIS — E663 Overweight: Secondary | ICD-10-CM | POA: Diagnosis not present

## 2019-05-16 DIAGNOSIS — M23303 Other meniscus derangements, unspecified medial meniscus, right knee: Secondary | ICD-10-CM | POA: Diagnosis not present

## 2019-05-16 DIAGNOSIS — E663 Overweight: Secondary | ICD-10-CM | POA: Diagnosis not present

## 2019-05-16 DIAGNOSIS — Z1389 Encounter for screening for other disorder: Secondary | ICD-10-CM | POA: Diagnosis not present

## 2019-05-16 DIAGNOSIS — Z6826 Body mass index (BMI) 26.0-26.9, adult: Secondary | ICD-10-CM | POA: Diagnosis not present

## 2019-05-22 DIAGNOSIS — M25461 Effusion, right knee: Secondary | ICD-10-CM | POA: Diagnosis not present

## 2019-05-22 DIAGNOSIS — R600 Localized edema: Secondary | ICD-10-CM | POA: Diagnosis not present

## 2019-05-22 DIAGNOSIS — M6751 Plica syndrome, right knee: Secondary | ICD-10-CM | POA: Diagnosis not present

## 2019-05-22 DIAGNOSIS — S83241A Other tear of medial meniscus, current injury, right knee, initial encounter: Secondary | ICD-10-CM | POA: Diagnosis not present

## 2019-05-29 DIAGNOSIS — S83241A Other tear of medial meniscus, current injury, right knee, initial encounter: Secondary | ICD-10-CM | POA: Diagnosis not present

## 2019-05-29 DIAGNOSIS — M25561 Pain in right knee: Secondary | ICD-10-CM | POA: Diagnosis not present

## 2019-06-01 ENCOUNTER — Ambulatory Visit: Payer: Self-pay | Admitting: Orthopedic Surgery

## 2019-06-01 ENCOUNTER — Other Ambulatory Visit: Payer: Self-pay | Admitting: Orthopedic Surgery

## 2019-06-01 NOTE — H&P (View-Only) (Signed)
Wesley Dunn is an 48 y.o. male.   Chief Complaint: R knee pain HPI: Presents today complaining of right knee pain medially, instability, throbbing following a twisting injury a month ago. He has had some aches and pains in this knee in the past before but never to this extent and once that would really only last for 1-2 days with no mechanical symptoms. He has Already had an MRI which did show a meniscus tear. He is wearing a knee sleeve for support.  Past Medical History:  Diagnosis Date  . Hypertension     No past surgical history on file.  Family History  Problem Relation Age of Onset  . Hypertension Mother   . Hypertension Father    Social History:  reports that he has been smoking cigars. He has never used smokeless tobacco. He reports current alcohol use of about 3.0 standard drinks of alcohol per week. He reports current drug use. Drug: Marijuana.  Allergies: No Known Allergies  (Not in a hospital admission)   No results found for this or any previous visit (from the past 48 hour(s)). No results found.  Review of Systems  Constitutional: Negative.   HENT: Negative.   Eyes: Negative.   Respiratory: Negative.   Cardiovascular: Negative.   Gastrointestinal: Negative.   Endocrine: Negative.   Genitourinary: Negative.   Musculoskeletal: Positive for arthralgias.  Neurological: Negative.     There were no vitals taken for this visit. Physical Exam  Constitutional: He is oriented to person, place, and time. He appears well-developed and well-nourished.  HENT:  Head: Normocephalic.  Eyes: Pupils are equal, round, and reactive to light.  Cardiovascular: Normal rate and regular rhythm.  Respiratory: Effort normal.  GI: Soft.  Musculoskeletal:     Cervical back: Normal range of motion.     Comments: Patient is a 48-year-old male.  Patient is awake, alert, oriented 3. Well-nourished and well-developed. Normal gait with no assistive devices.    On examination of  the knee, tender on palpation of the medial joint line. Nontender lateral joint line, patellar tendon, quadriceps tendon, patella, peroneal nerve and popliteal space. No calf pain or sign of DVT. No pain or laxity with varus or valgus stress. No instability noted. Painful medial McMurray's. Trace effusion noted. range of motion -5-110. Negative patellofemoral crepitus. No patellofemoral pain on compression. Sensation intact distally.   Neurological: He is alert and oriented to person, place, and time.  Skin: Skin is warm and dry.    X-rays ordered, obtained, reviewed today with no fracture, subluxation, dislocation, lytic or blastic lesions. Mild medial joint space narrowing bilaterally. Patellar tracking midline.  MRI images and report reviewed today by Dr. Beane. There is a broken bucket-handle medial meniscus tear. What appears to be an ACL sprain.  Assessment/Plan Impression: Right knee medial meniscus tear symptomatic with mechanical symptoms following a twisting injury 1 month ago  Plan: Discussed relevant anatomy and etiology of symptoms. Demonstrated quad strengthening exercises. Recommend arch supports and avoiding walking barefoot. Discussed activity modifications to avoid exacerbations. We discussed his underlying mild arthritic changes. Given his ongoing mechanical symptoms would recommend proceeding with right knee arthroscopy and partial medial meniscectomy and debridement. He is otherwise healthy and does not require any clearance. Denies any history of MRSA or DVT. He is able to take aspirin. We will proceed accordingly with scheduling. We discussed postoperative protocols, time out of work, home exercise program, need for ice and elevation. He was seen in conjunction with Dr. Beane today.   All his questions were answered and he desires to proceed.  Plan R knee scope, partial medial meniscectomy, debridement  Dorothy Spark, PA-C for Dr. Shelle Iron 06/01/2019, 8:51 AM

## 2019-06-01 NOTE — H&P (Signed)
Wesley Dunn is an 49 y.o. male.   Chief Complaint: R knee pain HPI: Presents today complaining of right knee pain medially, instability, throbbing following a twisting injury a month ago. He has had some aches and pains in this knee in the past before but never to this extent and once that would really only last for 1-2 days with no mechanical symptoms. He has Already had an MRI which did show a meniscus tear. He is wearing a knee sleeve for support.  Past Medical History:  Diagnosis Date  . Hypertension     No past surgical history on file.  Family History  Problem Relation Age of Onset  . Hypertension Mother   . Hypertension Father    Social History:  reports that he has been smoking cigars. He has never used smokeless tobacco. He reports current alcohol use of about 3.0 standard drinks of alcohol per week. He reports current drug use. Drug: Marijuana.  Allergies: No Known Allergies  (Not in a hospital admission)   No results found for this or any previous visit (from the past 48 hour(s)). No results found.  Review of Systems  Constitutional: Negative.   HENT: Negative.   Eyes: Negative.   Respiratory: Negative.   Cardiovascular: Negative.   Gastrointestinal: Negative.   Endocrine: Negative.   Genitourinary: Negative.   Musculoskeletal: Positive for arthralgias.  Neurological: Negative.     There were no vitals taken for this visit. Physical Exam  Constitutional: He is oriented to person, place, and time. He appears well-developed and well-nourished.  HENT:  Head: Normocephalic.  Eyes: Pupils are equal, round, and reactive to light.  Cardiovascular: Normal rate and regular rhythm.  Respiratory: Effort normal.  GI: Soft.  Musculoskeletal:     Cervical back: Normal range of motion.     Comments: Patient is a 49 year old male.  Patient is awake, alert, oriented 3. Well-nourished and well-developed. Normal gait with no assistive devices.    On examination of  the knee, tender on palpation of the medial joint line. Nontender lateral joint line, patellar tendon, quadriceps tendon, patella, peroneal nerve and popliteal space. No calf pain or sign of DVT. No pain or laxity with varus or valgus stress. No instability noted. Painful medial McMurray's. Trace effusion noted. range of motion -5-110. Negative patellofemoral crepitus. No patellofemoral pain on compression. Sensation intact distally.   Neurological: He is alert and oriented to person, place, and time.  Skin: Skin is warm and dry.    X-rays ordered, obtained, reviewed today with no fracture, subluxation, dislocation, lytic or blastic lesions. Mild medial joint space narrowing bilaterally. Patellar tracking midline.  MRI images and report reviewed today by Dr. Tonita Cong. There is a broken bucket-handle medial meniscus tear. What appears to be an ACL sprain.  Assessment/Plan Impression: Right knee medial meniscus tear symptomatic with mechanical symptoms following a twisting injury 1 month ago  Plan: Discussed relevant anatomy and etiology of symptoms. Demonstrated quad strengthening exercises. Recommend arch supports and avoiding walking barefoot. Discussed activity modifications to avoid exacerbations. We discussed his underlying mild arthritic changes. Given his ongoing mechanical symptoms would recommend proceeding with right knee arthroscopy and partial medial meniscectomy and debridement. He is otherwise healthy and does not require any clearance. Denies any history of MRSA or DVT. He is able to take aspirin. We will proceed accordingly with scheduling. We discussed postoperative protocols, time out of work, home exercise program, need for ice and elevation. He was seen in conjunction with Dr. Tonita Cong today.  All his questions were answered and he desires to proceed.  Plan R knee scope, partial medial meniscectomy, debridement  Dorothy Spark, PA-C for Dr. Shelle Iron 06/01/2019, 8:51 AM

## 2019-06-05 NOTE — Patient Instructions (Addendum)
DUE TO COVID-19 ONLY TWO VISITOR IS ALLOWED TO COME WITH YOU AND STAY IN THE WAITING ROOM ONLY DURING PRE OP AND PROCEDURE DAY OF SURGERY. THE 2 VISITORS MAY VISIT WITH YOU AFTER SURGERY IN YOUR PRIVATE ROOM DURING VISITING HOURS ONLY!  YOU NEED TO HAVE A COVID 19 TEST ON:06/09/19  @ 11:00 am , THIS TEST MUST BE DONE BEFORE SURGERY, COME  [address], [place] , [address].  [facility]) ONCE YOUR COVID TEST IS COMPLETED, PLEASE BEGIN THE QUARANTINE INSTRUCTIONS AS OUTLINED IN YOUR HANDOUT.                Derrick Ravel    Your procedure is scheduled on: 06/13/19   Report to [facility] Main  Entrance   Report to SHORT STAY at: 5:30 AM     Call this number if you have problems the morning of surgery [phone]    Remember:   . BRUSH YOUR TEETH MORNING OF SURGERY AND RINSE YOUR MOUTH OUT, NO CHEWING GUM CANDY OR MINTS.                You may not have any metal on your body including hair pins and              piercings  Do not wear jewelry,lotions, powders or perfumes, deodorant             Men may shave face and neck.   Do not bring valuables to the hospital. [facility] IS NOT             RESPONSIBLE   FOR VALUABLES.  Contacts, dentures or bridgework may not be worn into surgery.  Leave suitcase in the car. After surgery it may be brought to your room.     Patients discharged the day of surgery will not be allowed to drive home. IF YOU ARE HAVING SURGERY AND GOING HOME THE SAME DAY, YOU MUST HAVE AN ADULT TO DRIVE YOU HOME AND BE WITH YOU FOR 24 HOURS. YOU MAY GO HOME BY TAXI OR UBER OR ORTHERWISE, BUT AN ADULT MUST ACCOMPANY YOU HOME AND STAY WITH YOU FOR 24 HOURS.  Name and phone number of your driver:  Special Instructions: N/A              Please read over the following fact sheets you were given: _____________________________________________________________________             NO SOLID FOOD AFTER MIDNIGHT THE NIGHT PRIOR TO SURGERY.  NOTHING BY MOUTH EXCEPT CLEAR LIQUIDS UNTIL: 4:30 AM . PLEASE FINISH ENSURE DRINK PER SURGEON ORDER  WHICH NEEDS TO BE COMPLETED AT: 4:30 AM.   CLEAR LIQUID DIET   Foods Allowed                                                                     Foods Excluded  Coffee and tea, regular and decaf                             liquids that you cannot  Plain Jell-O any favor except red or purple  see through such as: Fruit ices (not with fruit pulp)                                     milk, soups, orange juice  Iced Popsicles                                    All solid food Carbonated beverages, regular and diet                                    Cranberry, grape and apple juices Sports drinks like Gatorade Lightly seasoned clear broth or consume(fat free) Sugar, honey syrup  Sample Menu Breakfast                                Lunch                                     Supper Cranberry juice                    Beef broth                            Chicken broth Jell-O                                     Grape juice                           Apple juice Coffee or tea                        Jell-O                                      Popsicle                                                Coffee or tea                        Coffee or tea  _____________________________________________________________________  [facility] - Preparing for Surgery Before surgery, you can play an important role.  Because skin is not sterile, your skin needs to be as free of germs as possible.  You can reduce the number of germs on your skin by washing with CHG (chlorahexidine gluconate) soap before surgery.  CHG is an antiseptic cleaner which kills germs and bonds with the skin to continue killing germs even after washing. Please DO NOT use if you have an allergy to CHG or antibacterial soaps.  If your skin becomes reddened/irritated stop using the CHG and inform your nurse  when you arrive at Short Stay. Do not shave (including legs and underarms)  for at least 48 hours prior to the first CHG shower.  You may shave your face/neck. Please follow these instructions carefully:  1.  Shower with CHG Soap the night before surgery and the  morning of Surgery.  2.  If you choose to wash your hair, wash your hair first as usual with your  normal  shampoo.  3.  After you shampoo, rinse your hair and body thoroughly to remove the  shampoo.                           4.  Use CHG as you would any other liquid soap.  You can apply chg directly  to the skin and wash                       Gently with a scrungie or clean washcloth.  5.  Apply the CHG Soap to your body ONLY FROM THE NECK DOWN.   Do not use on face/ open                           Wound or open sores. Avoid contact with eyes, ears mouth and genitals (private parts).                       Wash face,  Genitals (private parts) with your normal soap.             6.  Wash thoroughly, paying special attention to the area where your surgery  will be performed.  7.  Thoroughly rinse your body with warm water from the neck down.  8.  DO NOT shower/wash with your normal soap after using and rinsing off  the CHG Soap.                9.  Pat yourself dry with a clean towel.            10.  Wear clean pajamas.            11.  Place clean sheets on your bed the night of your first shower and do not  sleep with pets. Day of Surgery : Do not apply any lotions/deodorants the morning of surgery.  Please wear clean clothes to the hospital/surgery center.  FAILURE TO FOLLOW THESE INSTRUCTIONS MAY RESULT IN THE CANCELLATION OF YOUR SURGERY PATIENT SIGNATURE_________________________________  NURSE SIGNATURE__________________________________  ________________________________________________________________________   Adam Phenix  An incentive spirometer is a tool that can help keep your lungs clear and active. This tool  measures how well you are filling your lungs with each breath. Taking long deep breaths may help reverse or decrease the chance of developing breathing (pulmonary) problems (especially infection) following:  A long period of time when you are unable to move or be active. BEFORE THE PROCEDURE   If the spirometer includes an indicator to show your best effort, your nurse or respiratory therapist will set it to a desired goal.  If possible, sit up straight or lean slightly forward. Try not to slouch.  Hold the incentive spirometer in an upright position. INSTRUCTIONS FOR USE  1. Sit on the edge of your bed if possible, or sit up as far as you can in bed or on a chair. 2. Hold the incentive spirometer in an upright position. 3. Breathe out normally. 4. Place the mouthpiece in your mouth and seal your lips tightly  around it. 5. Breathe in slowly and as deeply as possible, raising the piston or the ball toward the top of the column. 6. Hold your breath for 3-5 seconds or for as long as possible. Allow the piston or ball to fall to the bottom of the column. 7. Remove the mouthpiece from your mouth and breathe out normally. 8. Rest for a few seconds and repeat Steps 1 through 7 at least 10 times every 1-2 hours when you are awake. Take your time and take a few normal breaths between deep breaths. 9. The spirometer may include an indicator to show your best effort. Use the indicator as a goal to work toward during each repetition. 10. After each set of 10 deep breaths, practice coughing to be sure your lungs are clear. If you have an incision (the cut made at the time of surgery), support your incision when coughing by placing a pillow or rolled up towels firmly against it. Once you are able to get out of bed, walk around indoors and cough well. You may stop using the incentive spirometer when instructed by your caregiver.  RISKS AND COMPLICATIONS  Take your time so you do not get dizzy or  light-headed.  If you are in pain, you may need to take or ask for pain medication before doing incentive spirometry. It is harder to take a deep breath if you are having pain. AFTER USE  Rest and breathe slowly and easily.  It can be helpful to keep track of a log of your progress. Your caregiver can provide you with a simple table to help with this. If you are using the spirometer at home, follow these instructions: [place] IF:   You are having difficultly using the spirometer.  You have trouble using the spirometer as often as instructed.  Your pain medication is not giving enough relief while using the spirometer.  You develop fever of 100.5 F (38.1 C) or higher. SEEK IMMEDIATE MEDICAL CARE IF:   You cough up bloody sputum that had not been present before.  You develop fever of 102 F (38.9 C) or greater.  You develop worsening pain at or near the incision site. MAKE SURE YOU:   Understand these instructions.  Will watch your condition.  Will get help right away if you are not doing well or get worse. Document Released: 07/12/2006 Document Revised: 05/24/2011 Document Reviewed: 09/12/2006 [facility] Patient Information 2014 [place], [place].   ________________________________________________________________________

## 2019-06-06 ENCOUNTER — Encounter (HOSPITAL_COMMUNITY)
Admission: RE | Admit: 2019-06-06 | Discharge: 2019-06-06 | Disposition: A | Discharge status: Home / Self Care | Payer: BC Managed Care – PPO | Source: Ambulatory Visit | Attending: Specialist | Admitting: Specialist

## 2019-06-06 ENCOUNTER — Other Ambulatory Visit: Payer: Self-pay

## 2019-06-06 ENCOUNTER — Encounter (HOSPITAL_COMMUNITY): Payer: Self-pay

## 2019-06-06 DIAGNOSIS — Z01812 Encounter for preprocedural laboratory examination: Secondary | ICD-10-CM | POA: Diagnosis not present

## 2019-06-06 HISTORY — DX: Anxiety disorder, unspecified: F41.9

## 2019-06-06 LAB — BASIC METABOLIC PANEL
Anion gap: 5 (ref 5–15)
BUN: 14 mg/dL (ref 6–20)
CO2: 30 mmol/L (ref 22–32)
Calcium: 9.2 mg/dL (ref 8.9–10.3)
Chloride: 100 mmol/L (ref 98–111)
Creatinine, Ser: 1.07 mg/dL (ref 0.61–1.24)
GFR calc Af Amer: >60 mL/min (ref >60)
GFR calc non Af Amer: >60 mL/min (ref >60)
Glucose, Bld: 113 mg/dL — ABNORMAL HIGH (ref 70–99)
Potassium: 5.1 mmol/L (ref 3.5–5.1)
Sodium: 135 mmol/L (ref 135–145)

## 2019-06-06 LAB — CBC
HCT: 51.9 % (ref 39.0–52.0)
Hemoglobin: 17.6 g/dL — ABNORMAL HIGH (ref 13.0–17.0)
MCH: 31.5 pg (ref 26.0–34.0)
MCHC: 33.9 g/dL (ref 30.0–36.0)
MCV: 93 fL (ref 80.0–100.0)
Platelets: 336 10*3/uL (ref 150–400)
RBC: 5.58 MIL/uL (ref 4.22–5.81)
RDW: 14.1 % (ref 11.5–15.5)
WBC: 5.9 10*3/uL (ref 4.0–10.5)
nRBC: 0 % (ref 0.0–0.2)

## 2019-06-06 NOTE — Progress Notes (Addendum)
PCP - Dr. Phillips Odor Cardiologist - Dr. Junius Argyle. LOV: 11/14/18. Epic.  Chest x-ray - 08/21/18 EPIC EKG - 08/22/18 EPIC Stress Test -  ECHO -  Cardiac Cath -   Sleep Study -  CPAP -   Fasting Blood Sugar -  Checks Blood Sugar _____ times a day  Blood Thinner Instructions: Aspirin Instructions: Last Dose:  Anesthesia review: Pt. Was informed by RN., that he should refrain from smoking and using marihuana before surgery.  Patient denies shortness of breath, fever, cough and chest pain at PAT appointment   Patient verbalized understanding of instructions that were given to them at the PAT appointment. Patient was also instructed that they will need to review over the PAT instructions again at home before surgery.

## 2019-06-09 ENCOUNTER — Other Ambulatory Visit (HOSPITAL_COMMUNITY)
Admission: RE | Admit: 2019-06-09 | Discharge: 2019-06-09 | Disposition: A | Discharge status: Home / Self Care | Payer: BC Managed Care – PPO | Source: Ambulatory Visit | Attending: Specialist | Admitting: Specialist

## 2019-06-09 DIAGNOSIS — Z20822 Contact with and (suspected) exposure to covid-19: Secondary | ICD-10-CM | POA: Diagnosis not present

## 2019-06-09 DIAGNOSIS — Z01812 Encounter for preprocedural laboratory examination: Secondary | ICD-10-CM | POA: Diagnosis not present

## 2019-06-09 LAB — SARS CORONAVIRUS 2 (TAT 6-24 HRS): SARS Coronavirus 2: NEGATIVE

## 2019-06-12 NOTE — Anesthesia Preprocedure Evaluation (Addendum)
Anesthesia Evaluation  Patient identified by MRN, date of birth, ID band Patient awake    Reviewed: Allergy & Precautions, H&P , NPO status , Patient's Chart, lab work & pertinent test results  Airway Mallampati: II  TM Distance: >3 FB Neck ROM: Full    Dental no notable dental hx. (+) Poor Dentition, Chipped, Missing, Loose,    Pulmonary neg pulmonary ROS, Current Smoker and Patient abstained from smoking.,    Pulmonary exam normal breath sounds clear to auscultation       Cardiovascular Exercise Tolerance: Good hypertension, Pt. on medications negative cardio ROS Normal cardiovascular exam Rhythm:Regular Rate:Normal     Neuro/Psych PSYCHIATRIC DISORDERS Anxiety negative neurological ROS  negative psych ROS   GI/Hepatic negative GI ROS, Neg liver ROS, GERD  Medicated,(+)     substance abuse  alcohol use,   Endo/Other  negative endocrine ROS  Renal/GU negative Renal ROS  negative genitourinary   Musculoskeletal negative musculoskeletal ROS (+)   Abdominal   Peds negative pediatric ROS (+)  Hematology negative hematology ROS (+)   Anesthesia Other Findings   Reproductive/Obstetrics negative OB ROS                            Anesthesia Physical Anesthesia Plan  ASA: III  Anesthesia Plan: General   Post-op Pain Management:    Induction: Intravenous  PONV Risk Score and Plan: 2 and Ondansetron, Dexamethasone, Treatment may vary due to age or medical condition and Midazolam  Airway Management Planned: LMA  Additional Equipment:   Intra-op Plan:   Post-operative Plan:   Informed Consent: I have reviewed the patients History and Physical, chart, labs and discussed the procedure including the risks, benefits and alternatives for the proposed anesthesia with the patient or authorized representative who has indicated his/her understanding and acceptance.       Plan Discussed  with: CRNA and Surgeon  Anesthesia Plan Comments: ( )        Anesthesia Quick Evaluation

## 2019-06-13 ENCOUNTER — Ambulatory Visit (HOSPITAL_COMMUNITY): Payer: BC Managed Care – PPO | Admitting: Anesthesiology

## 2019-06-13 ENCOUNTER — Encounter (HOSPITAL_COMMUNITY)
Admission: RE | Disposition: A | Discharge status: Home / Self Care | Payer: Self-pay | Source: Ambulatory Visit | Attending: Specialist

## 2019-06-13 ENCOUNTER — Encounter (HOSPITAL_COMMUNITY): Payer: Self-pay | Admitting: Specialist

## 2019-06-13 ENCOUNTER — Ambulatory Visit (HOSPITAL_COMMUNITY)
Admission: RE | Admit: 2019-06-13 | Discharge: 2019-06-13 | Disposition: A | Discharge status: Home / Self Care | Payer: BC Managed Care – PPO | Source: Ambulatory Visit | Attending: Specialist | Admitting: Specialist

## 2019-06-13 DIAGNOSIS — S83211A Bucket-handle tear of medial meniscus, current injury, right knee, initial encounter: Secondary | ICD-10-CM | POA: Insufficient documentation

## 2019-06-13 DIAGNOSIS — Y929 Unspecified place or not applicable: Secondary | ICD-10-CM | POA: Insufficient documentation

## 2019-06-13 DIAGNOSIS — K219 Gastro-esophageal reflux disease without esophagitis: Secondary | ICD-10-CM | POA: Insufficient documentation

## 2019-06-13 DIAGNOSIS — F419 Anxiety disorder, unspecified: Secondary | ICD-10-CM | POA: Diagnosis not present

## 2019-06-13 DIAGNOSIS — F17290 Nicotine dependence, other tobacco product, uncomplicated: Secondary | ICD-10-CM | POA: Insufficient documentation

## 2019-06-13 DIAGNOSIS — Y9389 Activity, other specified: Secondary | ICD-10-CM | POA: Diagnosis not present

## 2019-06-13 DIAGNOSIS — I10 Essential (primary) hypertension: Secondary | ICD-10-CM | POA: Diagnosis not present

## 2019-06-13 DIAGNOSIS — M94261 Chondromalacia, right knee: Secondary | ICD-10-CM | POA: Diagnosis not present

## 2019-06-13 DIAGNOSIS — S83241A Other tear of medial meniscus, current injury, right knee, initial encounter: Secondary | ICD-10-CM | POA: Diagnosis not present

## 2019-06-13 DIAGNOSIS — Z8249 Family history of ischemic heart disease and other diseases of the circulatory system: Secondary | ICD-10-CM | POA: Insufficient documentation

## 2019-06-13 DIAGNOSIS — X501XXA Overexertion from prolonged static or awkward postures, initial encounter: Secondary | ICD-10-CM | POA: Insufficient documentation

## 2019-06-13 HISTORY — PX: KNEE ARTHROSCOPY WITH MEDIAL MENISECTOMY: SHX5651

## 2019-06-13 SURGERY — ARTHROSCOPY, KNEE, WITH MEDIAL MENISCECTOMY
Anesthesia: General | Site: Knee | Laterality: Right

## 2019-06-13 MED ORDER — FENTANYL CITRATE (PF) 100 MCG/2ML IJ SOLN
INTRAMUSCULAR | Status: AC
  Filled 2019-06-13: qty 2

## 2019-06-13 MED ORDER — CEFAZOLIN SODIUM-DEXTROSE 2-4 GM/100ML-% IV SOLN
INTRAVENOUS | Status: AC
  Filled 2019-06-13: qty 100

## 2019-06-13 MED ORDER — MIDAZOLAM HCL 5 MG/5ML IJ SOLN
INTRAMUSCULAR | Status: DC | PRN
  Administered 2019-06-13: 2 mg via INTRAVENOUS

## 2019-06-13 MED ORDER — FENTANYL CITRATE (PF) 100 MCG/2ML IJ SOLN
INTRAMUSCULAR | Status: DC | PRN
  Administered 2019-06-13 (×2): 25 ug via INTRAVENOUS
  Administered 2019-06-13 (×3): 50 ug via INTRAVENOUS

## 2019-06-13 MED ORDER — EPHEDRINE 5 MG/ML INJ
INTRAVENOUS | Status: AC
  Filled 2019-06-13: qty 10

## 2019-06-13 MED ORDER — ONDANSETRON HCL 4 MG/2ML IJ SOLN: 4.0000 mg | Freq: Once | INTRAMUSCULAR | Status: DC | PRN

## 2019-06-13 MED ORDER — ONDANSETRON HCL 4 MG/2ML IJ SOLN
INTRAMUSCULAR | Status: DC | PRN
  Administered 2019-06-13: 4 mg via INTRAVENOUS

## 2019-06-13 MED ORDER — CHLORHEXIDINE GLUCONATE 4 % EX LIQD: 60.0000 mL | Freq: Once | CUTANEOUS | Status: DC

## 2019-06-13 MED ORDER — MEPERIDINE HCL 50 MG/ML IJ SOLN: 6.2500 mg | INTRAMUSCULAR | Status: DC | PRN

## 2019-06-13 MED ORDER — LIDOCAINE 2% (20 MG/ML) 5 ML SYRINGE
INTRAMUSCULAR | Status: AC
  Filled 2019-06-13: qty 5

## 2019-06-13 MED ORDER — OXYCODONE HCL 5 MG PO TABS: 5.0000 mg | ORAL_TABLET | ORAL | 0 refills | Status: AC | PRN

## 2019-06-13 MED ORDER — EPHEDRINE SULFATE-NACL 50-0.9 MG/10ML-% IV SOSY
PREFILLED_SYRINGE | INTRAVENOUS | Status: DC | PRN
  Administered 2019-06-13 (×2): 5 mg via INTRAVENOUS

## 2019-06-13 MED ORDER — ACETAMINOPHEN 325 MG PO TABS: 325.0000 mg | ORAL_TABLET | ORAL | Status: DC | PRN

## 2019-06-13 MED ORDER — CEFAZOLIN SODIUM-DEXTROSE 2-4 GM/100ML-% IV SOLN
2.0000 g | INTRAVENOUS | Status: AC
  Administered 2019-06-13: 2 g via INTRAVENOUS

## 2019-06-13 MED ORDER — DEXAMETHASONE SODIUM PHOSPHATE 10 MG/ML IJ SOLN
INTRAMUSCULAR | Status: AC
  Filled 2019-06-13: qty 1

## 2019-06-13 MED ORDER — BUPIVACAINE HCL (PF) 0.25 % IJ SOLN
INTRAMUSCULAR | Status: AC
  Filled 2019-06-13: qty 30

## 2019-06-13 MED ORDER — ACETAMINOPHEN 10 MG/ML IV SOLN
INTRAVENOUS | Status: AC
  Filled 2019-06-13: qty 100

## 2019-06-13 MED ORDER — ACETAMINOPHEN 160 MG/5ML PO SOLN: 325.0000 mg | ORAL | Status: DC | PRN

## 2019-06-13 MED ORDER — DEXAMETHASONE SODIUM PHOSPHATE 10 MG/ML IJ SOLN
INTRAMUSCULAR | Status: DC | PRN
  Administered 2019-06-13: 8 mg via INTRAVENOUS

## 2019-06-13 MED ORDER — ACETAMINOPHEN 10 MG/ML IV SOLN
INTRAVENOUS | Status: DC | PRN
  Administered 2019-06-13: 1000 mg via INTRAVENOUS

## 2019-06-13 MED ORDER — PROPOFOL 10 MG/ML IV BOLUS
INTRAVENOUS | Status: AC
  Filled 2019-06-13: qty 20

## 2019-06-13 MED ORDER — IBUPROFEN 800 MG PO TABS: 800.0000 mg | ORAL_TABLET | Freq: Three times a day (TID) | ORAL | 0 refills | Status: AC | PRN

## 2019-06-13 MED ORDER — LACTATED RINGERS IV SOLN
INTRAVENOUS | Status: DC | PRN
  Administered 2019-06-13: 2 mL

## 2019-06-13 MED ORDER — GLYCOPYRROLATE PF 0.2 MG/ML IJ SOSY
PREFILLED_SYRINGE | INTRAMUSCULAR | Status: AC
  Filled 2019-06-13: qty 1

## 2019-06-13 MED ORDER — PROPOFOL 10 MG/ML IV BOLUS
INTRAVENOUS | Status: DC | PRN
  Administered 2019-06-13: 160 mg via INTRAVENOUS

## 2019-06-13 MED ORDER — GLYCOPYRROLATE 0.2 MG/ML IJ SOLN
INTRAMUSCULAR | Status: DC | PRN
  Administered 2019-06-13: .2 mg via INTRAVENOUS

## 2019-06-13 MED ORDER — ASPIRIN EC 81 MG PO TBEC: 81.0000 mg | DELAYED_RELEASE_TABLET | Freq: Every day | ORAL | 1 refills | Status: AC

## 2019-06-13 MED ORDER — BUPIVACAINE-EPINEPHRINE (PF) 0.5% -1:200000 IJ SOLN
INTRAMUSCULAR | Status: AC
  Filled 2019-06-13: qty 30

## 2019-06-13 MED ORDER — EPINEPHRINE PF 1 MG/ML IJ SOLN
INTRAMUSCULAR | Status: AC
  Filled 2019-06-13: qty 2

## 2019-06-13 MED ORDER — LACTATED RINGERS IV SOLN: INTRAVENOUS | Status: DC

## 2019-06-13 MED ORDER — OXYCODONE HCL 5 MG PO TABS: 5.0000 mg | ORAL_TABLET | Freq: Once | ORAL | Status: DC | PRN

## 2019-06-13 MED ORDER — ONDANSETRON HCL 4 MG/2ML IJ SOLN
INTRAMUSCULAR | Status: AC
  Filled 2019-06-13: qty 2

## 2019-06-13 MED ORDER — OXYCODONE HCL 5 MG/5ML PO SOLN: 5.0000 mg | Freq: Once | ORAL | Status: DC | PRN

## 2019-06-13 MED ORDER — LACTATED RINGERS IR SOLN
Status: DC | PRN
  Administered 2019-06-13 (×4): 3000 mL

## 2019-06-13 MED ORDER — BUPIVACAINE-EPINEPHRINE (PF) 0.5% -1:200000 IJ SOLN
INTRAMUSCULAR | Status: DC | PRN
  Administered 2019-06-13: 9 mL

## 2019-06-13 MED ORDER — LIDOCAINE 2% (20 MG/ML) 5 ML SYRINGE
INTRAMUSCULAR | Status: DC | PRN
  Administered 2019-06-13: 80 mg via INTRAVENOUS

## 2019-06-13 MED ORDER — MIDAZOLAM HCL 2 MG/2ML IJ SOLN
INTRAMUSCULAR | Status: AC
  Filled 2019-06-13: qty 2

## 2019-06-13 MED ORDER — FENTANYL CITRATE (PF) 100 MCG/2ML IJ SOLN
25.0000 ug | INTRAMUSCULAR | Status: DC | PRN
  Administered 2019-06-13 (×2): 50 ug via INTRAVENOUS

## 2019-06-13 SURGICAL SUPPLY — 24 items
ABLATOR ASPIRATE 50D MULTI-PRT (SURGICAL WAND) ×2 IMPLANT
BLADE SHAVER TORPEDO 4X13 (MISCELLANEOUS) ×2 IMPLANT
BNDG ELASTIC 6X5.8 VLCR STR LF (GAUZE/BANDAGES/DRESSINGS) ×2 IMPLANT
BOOTIES KNEE HIGH SLOAN (MISCELLANEOUS) ×4 IMPLANT
COVER SURGICAL LIGHT HANDLE (MISCELLANEOUS) ×2 IMPLANT
COVER WAND RF STERILE (DRAPES) ×2 IMPLANT
DRSG PAD ABDOMINAL 8X10 ST (GAUZE/BANDAGES/DRESSINGS) ×2 IMPLANT
DURAPREP 26ML APPLICATOR (WOUND CARE) ×2 IMPLANT
GAUZE SPONGE 4X4 12PLY STRL (GAUZE/BANDAGES/DRESSINGS) ×2 IMPLANT
GLOVE BIOGEL PI IND STRL 7.5 (GLOVE) ×1 IMPLANT
GLOVE BIOGEL PI INDICATOR 7.5 (GLOVE) ×1
GLOVE SURG SS PI 7.5 STRL IVOR (GLOVE) ×2 IMPLANT
GLOVE SURG SS PI 8.0 STRL IVOR (GLOVE) ×2 IMPLANT
GOWN STRL REUS W/TWL XL LVL3 (GOWN DISPOSABLE) ×4 IMPLANT
KIT TURNOVER KIT A (KITS) IMPLANT
MANIFOLD NEPTUNE II (INSTRUMENTS) ×2 IMPLANT
PACK ARTHROSCOPY WL (CUSTOM PROCEDURE TRAY) ×2 IMPLANT
PADDING CAST COTTON 6X4 STRL (CAST SUPPLIES) ×2 IMPLANT
PENCIL SMOKE EVACUATOR (MISCELLANEOUS) IMPLANT
SUT ETHILON 4 0 PS 2 18 (SUTURE) ×2 IMPLANT
TUBING ARTHROSCOPY IRRIG 16FT (MISCELLANEOUS) ×2 IMPLANT
WAND HAND CNTRL MULTIVAC 90 (MISCELLANEOUS) IMPLANT
WIPE CHG CHLORHEXIDINE 2% (PERSONAL CARE ITEMS) ×2 IMPLANT
WRAP KNEE MAXI GEL POST OP (GAUZE/BANDAGES/DRESSINGS) ×2 IMPLANT

## 2019-06-13 NOTE — Anesthesia Procedure Notes (Addendum)
Procedure Name: LMA Insertion Date/Time: 06/13/2019 7:30 AM Performed by: Elisabeth Cara, CRNA Pre-anesthesia Checklist: Patient identified, Emergency Drugs available, Suction available, Patient being monitored and Timeout performed Patient Re-evaluated:Patient Re-evaluated prior to induction Oxygen Delivery Method: Circle system utilized Preoxygenation: Pre-oxygenation with 100% oxygen Induction Type: IV induction LMA: LMA with gastric port inserted Number of attempts: 1 Placement Confirmation: positive ETCO2 and breath sounds checked- equal and bilateral Tube secured with: Tape Dental Injury: Teeth and Oropharynx as per pre-operative assessment  Comments: Preop patient stated all his lower front teeth were loose. Dentition unchanged after LMA placement.

## 2019-06-13 NOTE — Discharge Instructions (Signed)
ARTHROSCOPIC KNEE SURGERY HOME CARE INSTRUCTIONS   PAIN You will be expected to have a moderate amount of pain in the affected knee for approximately two weeks.  However, the first two to four days will be the most severe in terms of the pain you will experience.  Prescriptions have been provided for you to take as needed for the pain.  The pain can be markedly reduced by using the ice/compressive bandage given.  Exchange the ice packs whenever they thaw.  During the night, keep the bandage on because it will still provide some compression for the swelling.  Also, keep the leg elevated on pillows above your heart, and this will help alleviate the pain and swelling.  MEDICATION Prescriptions have been provided to take as needed for pain. To prevent blood clots, take Aspirin 325mg  daily with a meal if not on a blood thinner and if no history of stomach ulcers.  ACTIVITY It is preferred that you stay on bedrest for approximately 24 hours.  However, you may go to the bathroom with help.  After this, you can start to be up and about progressively more.  Remember that the swelling may still increase after three to four days if you are up and doing too much.  You may put as much weight on the affected leg as pain will allow.  Use your crutches for comfort and safety.  However, as soon as you are able, you may discard the crutches and go without them.   DRESSING Keep the current dressing as dry as possible.  Two days after your surgery, you may remove the ice/compressive wrap, and surgical dressing.  You may now take a shower, but do not scrub the sounds directly with soap.  Let water rinse over these and gently wipe with your hand.  Reapply band-aids over the puncture wounds and more gauze if needed.  A slight amount of thin drainage can be normal at this time, and do not let it frighten you.  Reapply the ice/compressive wrap.  You may now repeat this every day each time you shower.  SYMPTOMS TO REPORT TO  YOUR DOCTOR  -Extreme pain.  -Extreme swelling.  -Temperature above 101 degrees that does not come down with acetaminophen     (Tylenol).  -Any changes in the feeling, color or movement of your toes.  -Extreme redness, heat, swelling or drainage at your incision  EXERCISE It is preferred that you begin to exercise on the day of your surgery.  Straight leg raises and short arc quads should be begun the afternoon or evening of surgery and continued until you come back for your follow-up appointment.   Attached is an instruction sheet on how to perform these two simple exercises.  Do these at least three times per day if not more.  You may bend your knee as much as is comfortable.  The puncture wounds may occasionally be slightly uncomfortable with bending of the knee.  Do not let this frighten you.  It is important to keep your knee motion, but do not overdo it.  If you have significant pain, simply do not bend the knee as far.   You will be given more exercises to perform at your first return visit.    RETURN APPOINTMENT Please make an appointment to be seen by your doctor in 10-14 days from your surgery.  Quadriceps (Straight Leg Raises)    Lie flat with ____ pound weight around left ankle. Keeping knee straight, lift ____  inches. May do exercise sitting with back against wall. Keep back straight. Hold ____ seconds. Repeat ____ times. Do ____ sessions per day. CAUTION: Move slowly, avoid jerking.  KNEE: Extension, Short Arc Quads - Supine    Place bolster under knees. Raise one leg until knee is straight. ___ reps per set, ___ sets per day, ___ days per week   Copyright  VHI. All rights reserved.    Copyright  VHI. All rights reserved.     General Anesthesia, Adult, Care After This sheet gives you information about how to care for yourself after your procedure. Your health care provider may also give you more specific instructions. If you have problems or questions, contact  your health care provider. What can I expect after the procedure? After the procedure, the following side effects are common:  Pain or discomfort at the IV site.  Nausea.  Vomiting.  Sore throat.  Trouble concentrating.  Feeling cold or chills.  Weak or tired.  Sleepiness and fatigue.  Soreness and body aches. These side effects can affect parts of the body that were not involved in surgery. Follow these instructions at home:  For at least 24 hours after the procedure:  Have a responsible adult stay with you. It is important to have someone help care for you until you are awake and alert.  Rest as needed.  Do not: ? Participate in activities in which you could fall or become injured. ? Drive. ? Use heavy machinery. ? Drink alcohol. ? Take sleeping pills or medicines that cause drowsiness. ? Make important decisions or sign legal documents. ? Take care of children on your own. Eating and drinking  Follow any instructions from your health care provider about eating or drinking restrictions.  When you feel hungry, start by eating small amounts of foods that are soft and easy to digest (bland), such as toast. Gradually return to your regular diet.  Drink enough fluid to keep your urine pale yellow.  If you vomit, rehydrate by drinking water, juice, or clear broth. General instructions  If you have sleep apnea, surgery and certain medicines can increase your risk for breathing problems. Follow instructions from your health care provider about wearing your sleep device: ? Anytime you are sleeping, including during daytime naps. ? While taking prescription pain medicines, sleeping medicines, or medicines that make you drowsy.  Return to your normal activities as told by your health care provider. Ask your health care provider what activities are safe for you.  Take over-the-counter and prescription medicines only as told by your health care provider.  If you smoke, do  not smoke without supervision.  Keep all follow-up visits as told by your health care provider. This is important. Contact a health care provider if:  You have nausea or vomiting that does not get better with medicine.  You cannot eat or drink without vomiting.  You have pain that does not get better with medicine.  You are unable to pass urine.  You develop a skin rash.  You have a fever.  You have redness around your IV site that gets worse. Get help right away if:  You have difficulty breathing.  You have chest pain.  You have blood in your urine or stool, or you vomit blood. Summary  After the procedure, it is common to have a sore throat or nausea. It is also common to feel tired.  Have a responsible adult stay with you for the first 24 hours after general  anesthesia. It is important to have someone help care for you until you are awake and alert.  When you feel hungry, start by eating small amounts of foods that are soft and easy to digest (bland), such as toast. Gradually return to your regular diet.  Drink enough fluid to keep your urine pale yellow.  Return to your normal activities as told by your health care provider. Ask your health care provider what activities are safe for you. This information is not intended to replace advice given to you by your health care provider. Make sure you discuss any questions you have with your health care provider. Document Revised: 03/04/2017 Document Reviewed: 10/15/2016 Elsevier Patient Education  2020 [employer].

## 2019-06-13 NOTE — Anesthesia Postprocedure Evaluation (Signed)
Anesthesia Post Note  Patient: YIANNI SKILLING  Procedure(s) Performed: RIGHT KNEE ARTHROSCOPY WITH PARTIAL MEDIAL MENISECTOMY AND DEBRIDEMENT (Right Knee)     Patient location during evaluation: PACU Anesthesia Type: General Level of consciousness: awake and alert Pain management: pain level controlled Vital Signs Assessment: post-procedure vital signs reviewed and stable Respiratory status: spontaneous breathing, nonlabored ventilation, respiratory function stable and patient connected to nasal cannula oxygen Cardiovascular status: blood pressure returned to baseline and stable Postop Assessment: no apparent nausea or vomiting Anesthetic complications: no    Last Vitals:  Vitals:   06/13/19 0915 06/13/19 0930  BP: 132/82 (!) 155/93  Pulse: 83 82  Resp: 14 13  Temp: (!) 36.4 C (!) 36.4 C  SpO2: 98% 98%    Last Pain:  Vitals:   06/13/19 0930  TempSrc:   PainSc: 3                  Aniyia Rane

## 2019-06-13 NOTE — Brief Op Note (Signed)
06/13/2019  8:47 AM  PATIENT:  Wesley Dunn  49 y.o. male  PRE-OPERATIVE DIAGNOSIS:  Right knee medial meniscus tear  POST-OPERATIVE DIAGNOSIS:  Right knee medial meniscus tear  PROCEDURE:  Procedure(s): RIGHT KNEE ARTHROSCOPY WITH PARTIAL MEDIAL MENISECTOMY AND DEBRIDEMENT (Right)  SURGEON:  Surgeon(s) and Role:    Jene Every, MD - Primary  PHYSICIAN ASSISTANT:   ASSISTANTS: Bissell   ANESTHESIA:   general  EBL:  10 mL   BLOOD ADMINISTERED:none  DRAINS: none   LOCAL MEDICATIONS USED:  MARCAINE     SPECIMEN:  No Specimen  DISPOSITION OF SPECIMEN:  N/A  COUNTS:  YES  TOURNIQUET:  * No tourniquets in log *  DICTATION: .Other Dictation: Dictation Number  [id number]  PLAN OF CARE: Discharge to home after PACU  PATIENT DISPOSITION:  PACU - hemodynamically stable.   Delay start of Pharmacological VTE agent (>24hrs) due to surgical blood loss or risk of bleeding: no

## 2019-06-13 NOTE — Interval H&P Note (Signed)
History and Physical Interval Note:  06/13/2019 7:18 AM  Wesley Dunn  has presented today for surgery, with the diagnosis of Right knee medial meniscus tear.  The various methods of treatment have been discussed with the patient and family. After consideration of risks, benefits and other options for treatment, the patient has consented to  Procedure(s): RIGHT KNEE ARTHROSCOPY WITH PARTIAL MEDIAL MENISECTOMY AND DEBRIDEMENT (Right) as a surgical intervention.  The patient's history has been reviewed, patient examined, no change in status, stable for surgery.  I have reviewed the patient's chart and labs.  Questions were answered to the patient's satisfaction.     Javier Docker

## 2019-06-13 NOTE — Op Note (Signed)
NAME: Wesley Dunn, MEISENHEIMER MEDICAL RECORD OX:[id number] ACCOUNT [id number] DATE OF BIRTH:04/09/1970 FACILITY: [facility] LOCATION: [facility] PHYSICIAN:Evanny Ellerbe Connye Burkitt, MD  OPERATIVE REPORT  DATE OF PROCEDURE:  06/13/2019  PREOPERATIVE DIAGNOSES:  Displaced bucket handle medial meniscus tear of the right knee, chondromalacia medial femoral condyle.  POSTOPERATIVE DIAGNOSES:  Displaced bucket handle medial meniscus tear of the right knee, chondromalacia medial femoral condyle.  PROCEDURE PERFORMED: 1.  Right knee arthroscopy. 2.  Partial medial meniscectomy, bucket handle tear, medial meniscus. 3.  Chondroplasty medial femoral condyle.  ANESTHESIA:  General.  ASSISTANT:  Andrez Grime, PA  HISTORY:  A 49 year old male with an injury with a medial meniscus tear with locking and giving way.  MRI confirmed the bucket handle tear, broken bucket.  Indicated for knee arthroscopy, partial meniscectomy.  Risks and benefits discussed including  bleeding, infection, damage to neurovascular structures, no change in symptoms, worsening symptoms, DVT, PE, anesthetic complications, etc.  DESCRIPTION OF PROCEDURE:  The patient in supine position.  After induction of adequate general anesthesia, 2 grams Kefzol or the right lower extremity was prepped and draped in the usual sterile fashion.  Exam under anesthesia revealed a negative  instability.  Negative Lachman, negative anterior drawer.  No instability to varus or valgus stressing at 0 to 30 degrees.  A lateral parapatellar portal was fashioned with a #11 blade.  Ingress cannula atraumatically placed.  Irrigant was utilized to insufflate the joint.  Under direct visualization, a medial parapatellar portal was fashioned with a #11 blade after  localization with an 18-gauge needle sparing the medial meniscus.  Noted was a displaced and flipped tear of the anterior portion of the bucket handle tear.  This was displaced into the medial gutter.  This was reduced  with a probe.  Then the base of  which was excised with a straight basket followed by a shaver and an ArthroWand.  The meniscal fragment was removed with a pituitary.  There was a grade III lesion off the posterior medial femoral condyle.  Then posteriorly there was a large flipped fragment into the intercondylar notch.  This was reduced with a probe.  The bottom,  which was then separated from its attachment near the posterior horn with a straight basket and then subsequently removed with a pituitary.  Residual tearing of that meniscus was then further removed with an Arthrocare wand.  I would say approximately  2/3 of that posterior 1/3 of the meniscus was involved and removed.  The root was stable to probe palpation.  Light chondroplasty was performed on medial femoral condyle.  No grade IV lesion was noted.  The entirety of the meniscus was stable to probe  palpation of the remnant.  ACL demonstrated some hyperemia, but it was intact.  Lateral compartment revealed normal femoral condyle, tibial plateau and meniscus stable to probe palpation.  Suprapatellar pouch revealed some minor grade III changes of the patellofemoral joint.  Light chondroplasty performed here.  There was normal patellofemoral tracking.  Gutters unremarkable.  We revisited all compartments.  No further pathology amenable to arthroscopic intervention after copious lavage of the medial compartment and inspection for any loose debris.  After none was detected all instrumentation was removed.  Portals were closed  with 4-0 nylon simple sutures.  Marcaine 0.25% with epinephrine was infiltrated into the joint.  Wound was dressed sterilely.  Awoken without difficulty and transported to the recovery room in satisfactory condition.  The patient tolerated the procedure well.  No complications.  Minimal blood loss.  CN/NUANCE  D:06/13/2019 T:06/13/2019 JOB:[id number]

## 2019-06-13 NOTE — Transfer of Care (Signed)
Immediate Anesthesia Transfer of Care Note  Patient: Wesley Dunn  Procedure(s) Performed: RIGHT KNEE ARTHROSCOPY WITH PARTIAL MEDIAL MENISECTOMY AND DEBRIDEMENT (Right Knee)  Patient Location: PACU  Anesthesia Type:General  Level of Consciousness: awake, alert , oriented and patient cooperative  Airway & Oxygen Therapy: Patient Spontanous Breathing and Patient connected to face mask oxygen  Post-op Assessment: Report given to RN, Post -op Vital signs reviewed and stable and Patient moving all extremities  Post vital signs: Reviewed and stable  Last Vitals:  Vitals Value Taken Time  BP 165/96 06/13/19 0851  Temp 36.4 C 06/13/19 0851  Pulse 94 06/13/19 0854  Resp 18 06/13/19 0854  SpO2 100 % 06/13/19 0854  Vitals shown include unvalidated device data.  Last Pain:  Vitals:   06/13/19 0600  TempSrc:   PainSc: 2       Patients Stated Pain Goal: 2 (06/13/19 0600)  Complications: No apparent anesthesia complications

## 2019-07-02 DIAGNOSIS — M25561 Pain in right knee: Secondary | ICD-10-CM | POA: Diagnosis not present

## 2019-10-09 DIAGNOSIS — Z13220 Encounter for screening for lipoid disorders: Secondary | ICD-10-CM | POA: Diagnosis not present

## 2019-10-09 DIAGNOSIS — Z713 Dietary counseling and surveillance: Secondary | ICD-10-CM | POA: Diagnosis not present

## 2019-10-09 DIAGNOSIS — Z0000 Encounter for general adult medical examination without abnormal findings: Secondary | ICD-10-CM | POA: Diagnosis not present

## 2020-05-19 DIAGNOSIS — Z6828 Body mass index (BMI) 28.0-28.9, adult: Secondary | ICD-10-CM | POA: Diagnosis not present

## 2020-05-19 DIAGNOSIS — I10 Essential (primary) hypertension: Secondary | ICD-10-CM | POA: Diagnosis not present

## 2020-05-19 DIAGNOSIS — E663 Overweight: Secondary | ICD-10-CM | POA: Diagnosis not present

## 2020-05-19 DIAGNOSIS — Z1331 Encounter for screening for depression: Secondary | ICD-10-CM | POA: Diagnosis not present

## 2020-06-19 DIAGNOSIS — Z713 Dietary counseling and surveillance: Secondary | ICD-10-CM | POA: Diagnosis not present

## 2020-06-20 DIAGNOSIS — F17200 Nicotine dependence, unspecified, uncomplicated: Secondary | ICD-10-CM | POA: Diagnosis not present

## 2020-06-20 DIAGNOSIS — I10 Essential (primary) hypertension: Secondary | ICD-10-CM | POA: Diagnosis not present

## 2020-06-20 DIAGNOSIS — Z23 Encounter for immunization: Secondary | ICD-10-CM | POA: Diagnosis not present

## 2020-06-20 DIAGNOSIS — E663 Overweight: Secondary | ICD-10-CM | POA: Diagnosis not present

## 2020-06-20 DIAGNOSIS — Z6827 Body mass index (BMI) 27.0-27.9, adult: Secondary | ICD-10-CM | POA: Diagnosis not present

## 2021-03-17 DIAGNOSIS — Z6827 Body mass index (BMI) 27.0-27.9, adult: Secondary | ICD-10-CM | POA: Diagnosis not present

## 2021-03-17 DIAGNOSIS — E782 Mixed hyperlipidemia: Secondary | ICD-10-CM | POA: Diagnosis not present

## 2021-03-17 DIAGNOSIS — R7309 Other abnormal glucose: Secondary | ICD-10-CM | POA: Diagnosis not present

## 2021-03-17 DIAGNOSIS — I10 Essential (primary) hypertension: Secondary | ICD-10-CM | POA: Diagnosis not present

## 2021-03-17 DIAGNOSIS — Z23 Encounter for immunization: Secondary | ICD-10-CM | POA: Diagnosis not present

## 2021-03-17 DIAGNOSIS — E663 Overweight: Secondary | ICD-10-CM | POA: Diagnosis not present

## 2021-03-28 IMAGING — CT CT ABDOMEN AND PELVIS WITH CONTRAST
2 of 5 series · 15 of 46 positions shown, 17 images · IV contrast (omnipaque)
Comparison: None.

CLINICAL DATA: Chest and mid back pain

EXAM:
CT ABDOMEN AND PELVIS WITH CONTRAST
TECHNIQUE: Multidetector CT imaging of the abdomen and pelvis was performed
using the standard protocol following bolus administration of
intravenous contrast.
CONTRAST:  100mL OMNIPAQUE IOHEXOL 300 MG/ML  SOLN

[Series 2: axial st · axial · 0.65mm/px · z∈[-613,-188]mm · 12 of 99 slices shown, 14 images]
[im 7/99  soft-tissue]
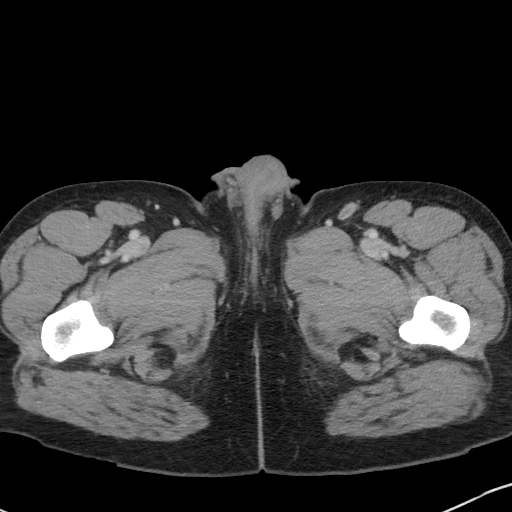
[im 7/99  bone]
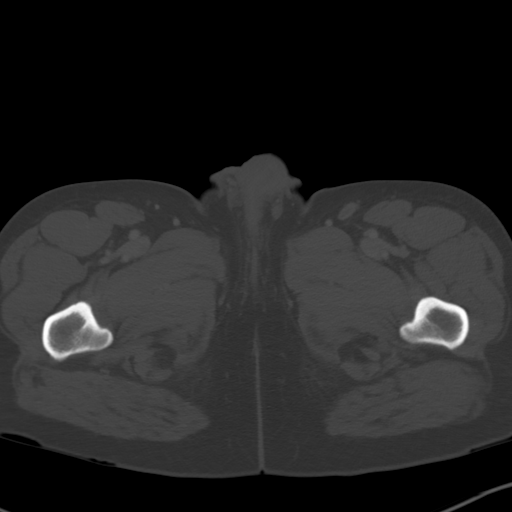
[im 13/99  soft-tissue]
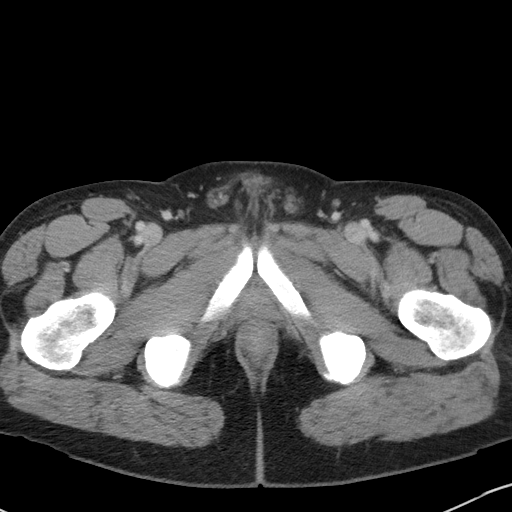
[im 25/99  soft-tissue]
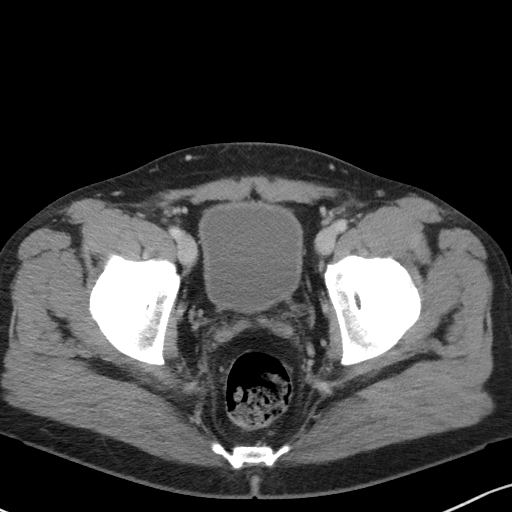
[im 31/99  soft-tissue]
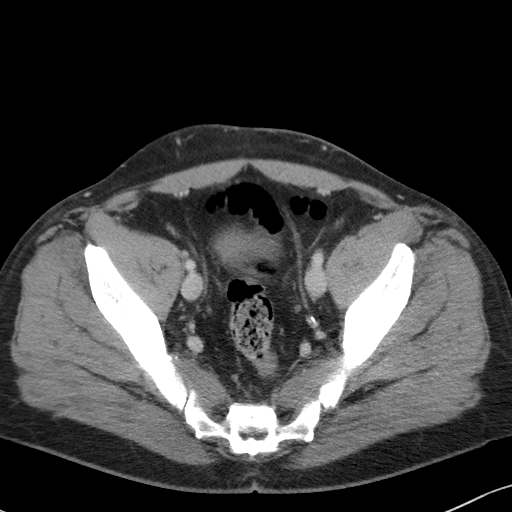
[im 37/99  soft-tissue]
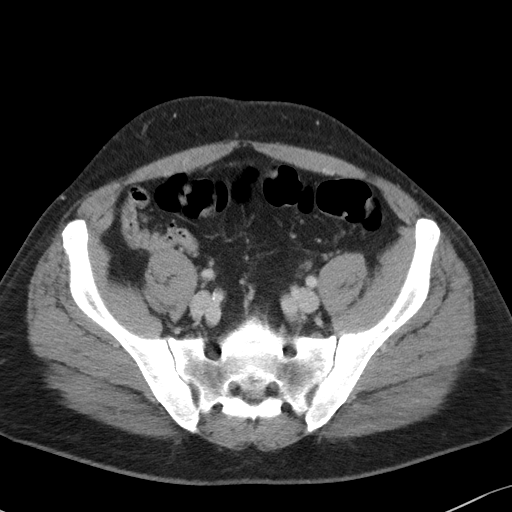
[im 43/99  soft-tissue]
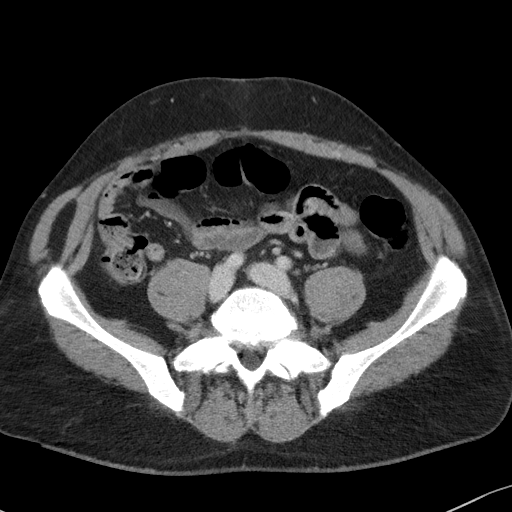
[im 56/99  soft-tissue]
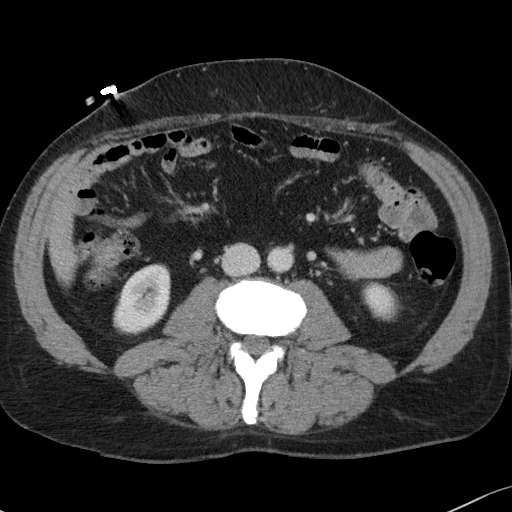
[im 62/99  soft-tissue]
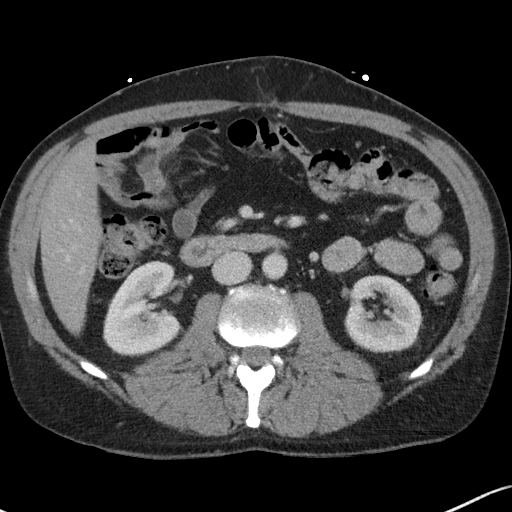
[im 68/99  soft-tissue]
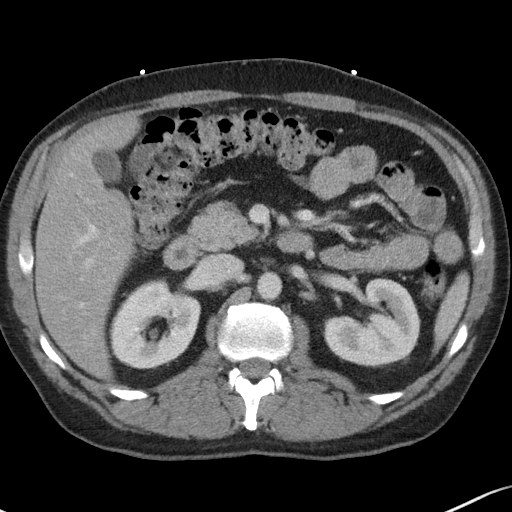
[im 68/99  bone]
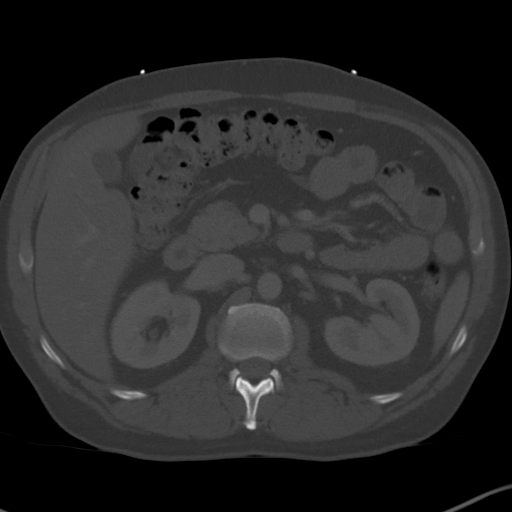
[im 74/99  soft-tissue]
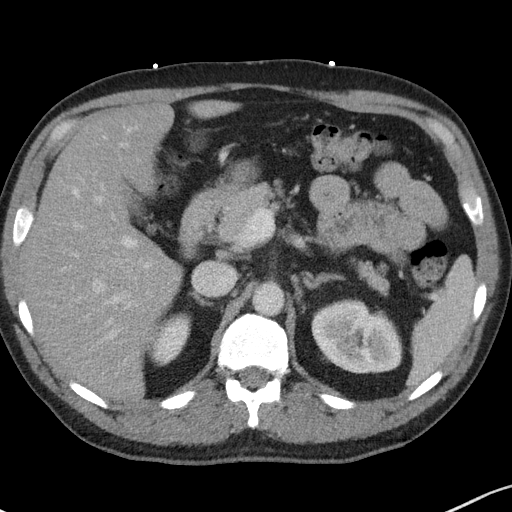
[im 86/99  soft-tissue]
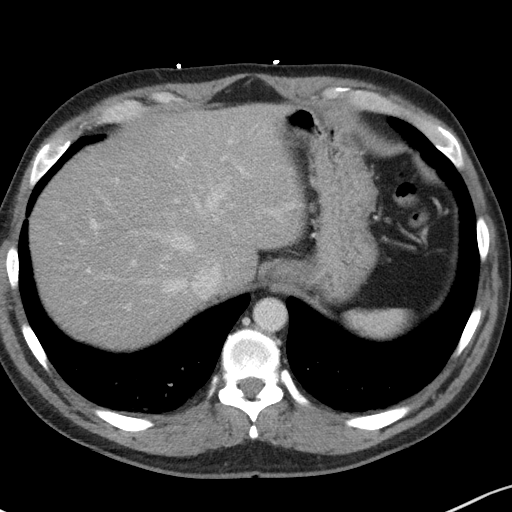
[im 92/99  soft-tissue]
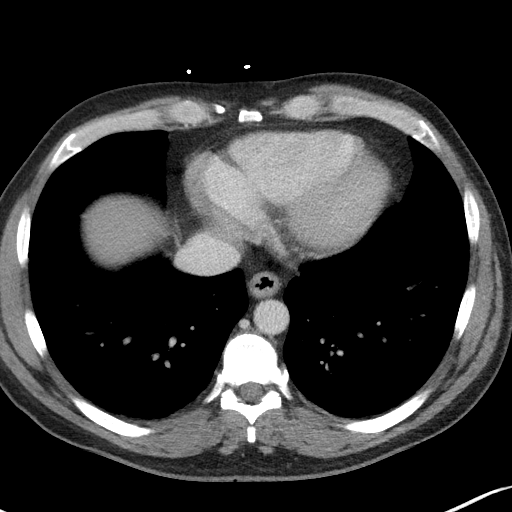

[Series 5: coronal st · coronal · 0.65mm/px · 3 of 90 slices shown]
[im 30/90  soft-tissue]
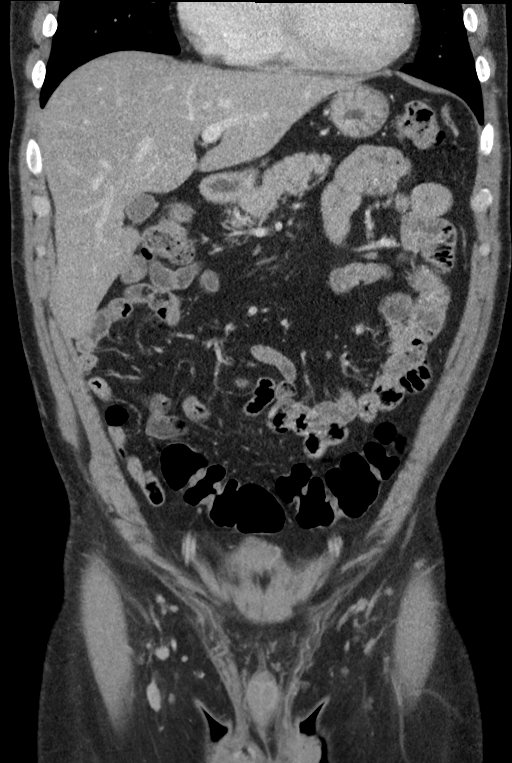
[im 40/90  soft-tissue]
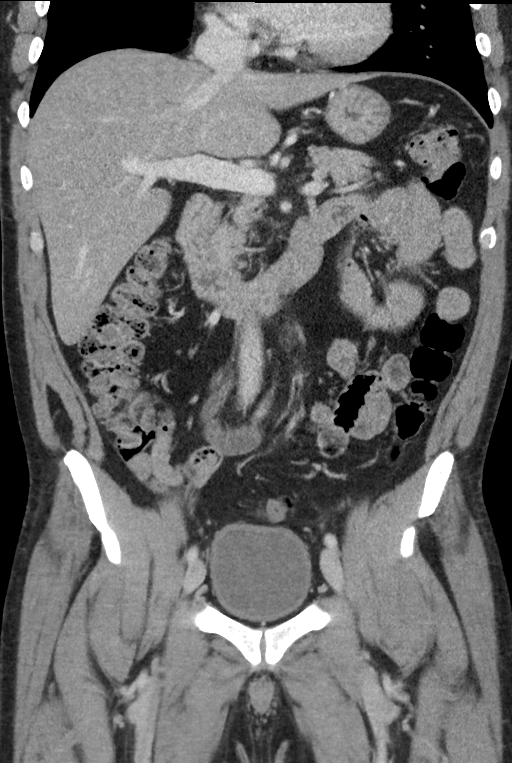
[im 50/90  soft-tissue]
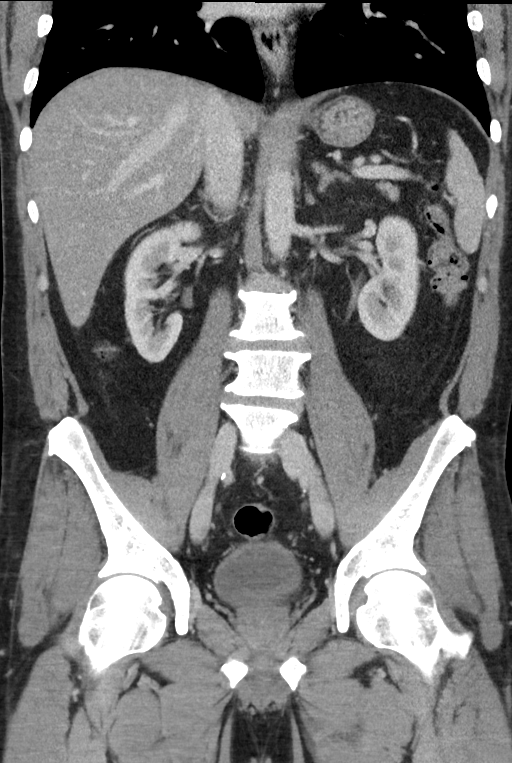

[15 of 46 positions shown; findings below may reference images not displayed]

FINDINGS: Lower chest: The heart size is mildly enlarged. The lung bases are
clear.

Hepatobiliary: There is a patent steatosis. The gallbladder is
unremarkable by CT standards.

Pancreas: Unremarkable. No pancreatic ductal dilatation or
surrounding inflammatory changes.

Spleen: Normal in size without focal abnormality.

Adrenals/Urinary Tract: Adrenal glands are unremarkable. Kidneys are
normal, without renal calculi, focal lesion, or hydronephrosis.
Bladder is unremarkable.

Stomach/Bowel: Stomach is within normal limits. The appendix is not
reliably identified. No evidence of bowel wall thickening,
distention, or inflammatory changes.

Vascular/Lymphatic: No significant vascular findings are present. No
enlarged abdominal or pelvic lymph nodes.

Reproductive: Prostate is unremarkable.

Other: There are a few fat containing periumbilical hernias. There
is no significant free fluid.

Musculoskeletal: No acute or significant osseous findings.
IMPRESSION: 1. No acute intra-abdominal abnormality detected.
2. Probable underlying hepatic steatosis.
3. A few fat containing periumbilical hernias are noted.

## 2021-03-28 IMAGING — DX CHEST - 2 VIEW
2 series · 2 of 2 positions shown · non-contrast
Comparison: Chest x-ray 12/10/2009.

CLINICAL DATA: 47-year-old male with history of chest pain
radiating to the mid back.

EXAM:
CHEST - 2 VIEW

[chest pa]
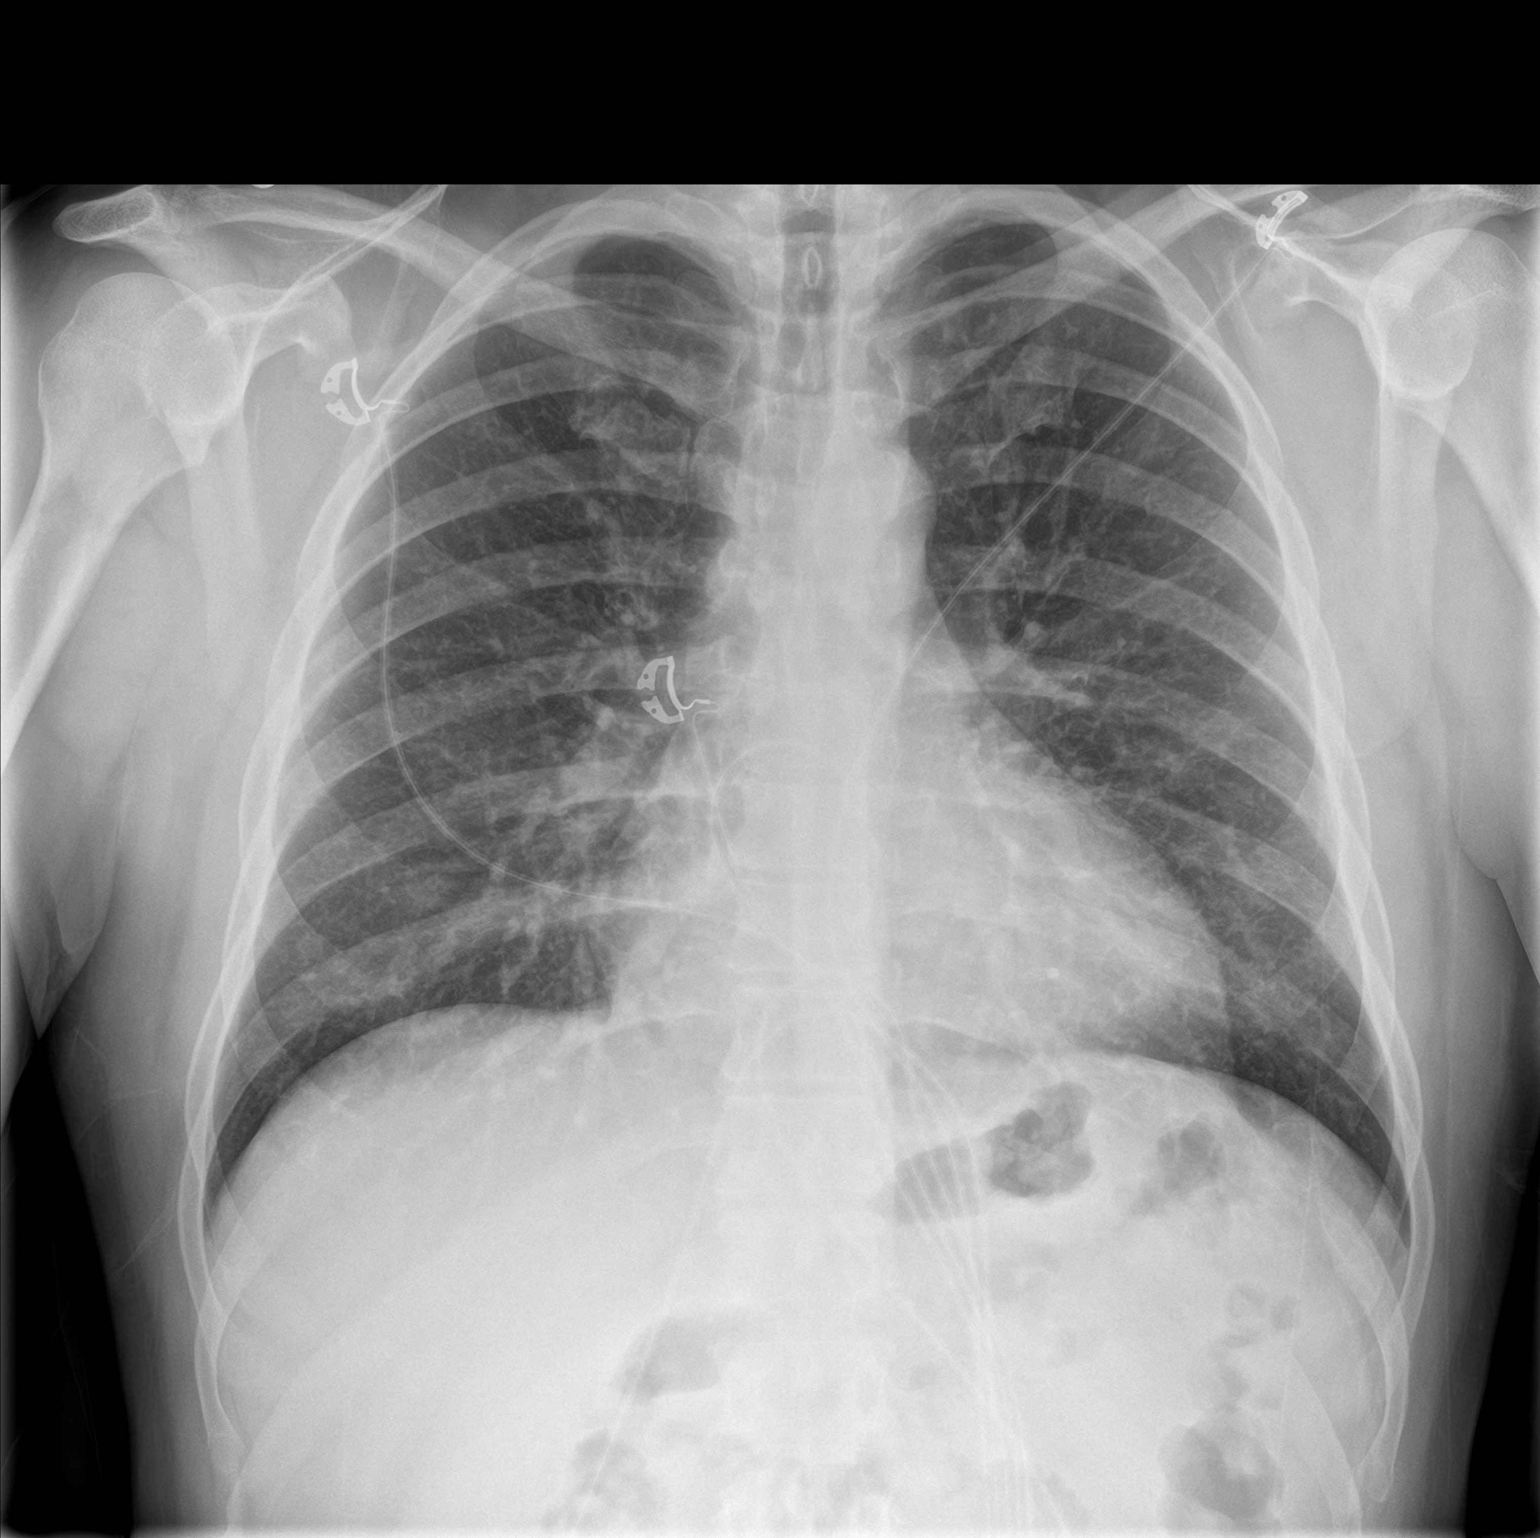

[chest lat]
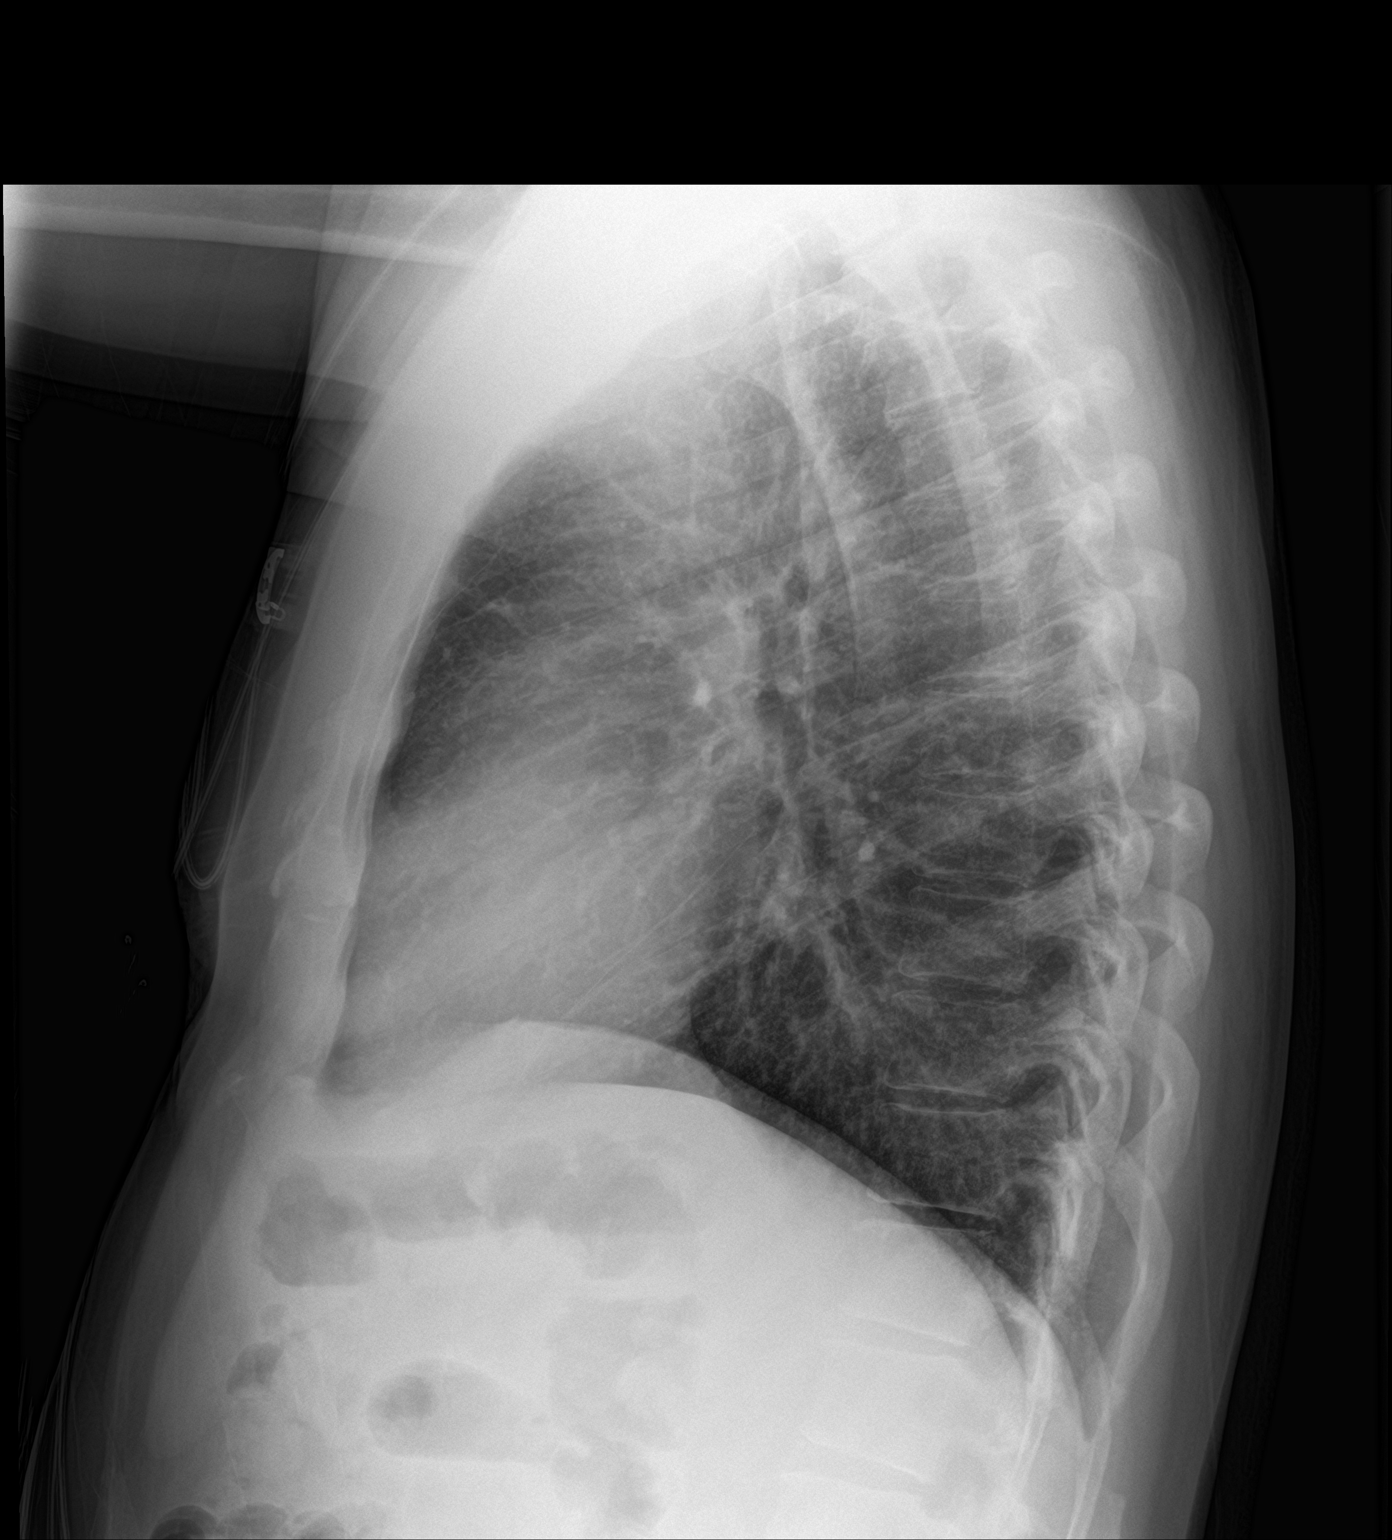

[2 of 2 positions shown; findings below may reference images not displayed]

FINDINGS: Lung volumes are normal. No consolidative airspace disease. No
pleural effusions. No pneumothorax. No pulmonary nodule or mass
noted. Pulmonary vasculature and the cardiomediastinal silhouette
are within normal limits.
IMPRESSION: No radiographic evidence of acute cardiopulmonary disease.

## 2021-05-15 DIAGNOSIS — E7849 Other hyperlipidemia: Secondary | ICD-10-CM | POA: Diagnosis not present

## 2021-05-15 DIAGNOSIS — I10 Essential (primary) hypertension: Secondary | ICD-10-CM | POA: Diagnosis not present

## 2021-05-15 DIAGNOSIS — R7309 Other abnormal glucose: Secondary | ICD-10-CM | POA: Diagnosis not present

## 2021-06-02 DIAGNOSIS — I10 Essential (primary) hypertension: Secondary | ICD-10-CM | POA: Diagnosis not present

## 2021-06-02 DIAGNOSIS — E663 Overweight: Secondary | ICD-10-CM | POA: Diagnosis not present

## 2021-06-02 DIAGNOSIS — Z6826 Body mass index (BMI) 26.0-26.9, adult: Secondary | ICD-10-CM | POA: Diagnosis not present

## 2021-06-02 DIAGNOSIS — R030 Elevated blood-pressure reading, without diagnosis of hypertension: Secondary | ICD-10-CM | POA: Diagnosis not present

## 2021-06-16 DIAGNOSIS — Z713 Dietary counseling and surveillance: Secondary | ICD-10-CM | POA: Diagnosis not present

## 2021-06-18 ENCOUNTER — Emergency Department (HOSPITAL_COMMUNITY)
Admission: EM | Admit: 2021-06-18 | Discharge: 2021-06-18 | Disposition: A | Discharge status: Home / Self Care | Payer: BC Managed Care – PPO | Attending: Emergency Medicine | Admitting: Emergency Medicine

## 2021-06-18 ENCOUNTER — Encounter (HOSPITAL_COMMUNITY): Payer: Self-pay | Admitting: *Deleted

## 2021-06-18 ENCOUNTER — Emergency Department (HOSPITAL_COMMUNITY): Payer: BC Managed Care – PPO

## 2021-06-18 DIAGNOSIS — J341 Cyst and mucocele of nose and nasal sinus: Secondary | ICD-10-CM | POA: Diagnosis not present

## 2021-06-18 DIAGNOSIS — R200 Anesthesia of skin: Secondary | ICD-10-CM | POA: Insufficient documentation

## 2021-06-18 DIAGNOSIS — Z7982 Long term (current) use of aspirin: Secondary | ICD-10-CM | POA: Diagnosis not present

## 2021-06-18 DIAGNOSIS — R4182 Altered mental status, unspecified: Secondary | ICD-10-CM | POA: Diagnosis not present

## 2021-06-18 DIAGNOSIS — I10 Essential (primary) hypertension: Secondary | ICD-10-CM | POA: Insufficient documentation

## 2021-06-18 DIAGNOSIS — R29818 Other symptoms and signs involving the nervous system: Secondary | ICD-10-CM | POA: Diagnosis not present

## 2021-06-18 DIAGNOSIS — Z79899 Other long term (current) drug therapy: Secondary | ICD-10-CM | POA: Diagnosis not present

## 2021-06-18 DIAGNOSIS — R410 Disorientation, unspecified: Secondary | ICD-10-CM | POA: Insufficient documentation

## 2021-06-18 DIAGNOSIS — H579 Unspecified disorder of eye and adnexa: Secondary | ICD-10-CM

## 2021-06-18 DIAGNOSIS — H539 Unspecified visual disturbance: Secondary | ICD-10-CM | POA: Diagnosis not present

## 2021-06-18 DIAGNOSIS — R202 Paresthesia of skin: Secondary | ICD-10-CM

## 2021-06-18 LAB — BASIC METABOLIC PANEL
Anion gap: 8 (ref 5–15)
BUN: 18 mg/dL (ref 6–20)
CO2: 22 mmol/L (ref 22–32)
Calcium: 9.2 mg/dL (ref 8.9–10.3)
Chloride: 106 mmol/L (ref 98–111)
Creatinine, Ser: 1.07 mg/dL (ref 0.61–1.24)
GFR, Estimated: >60 mL/min (ref >60)
Glucose, Bld: 98 mg/dL (ref 70–99)
Potassium: 4.2 mmol/L (ref 3.5–5.1)
Sodium: 136 mmol/L (ref 135–145)

## 2021-06-18 LAB — CBC
HCT: 46 % (ref 39.0–52.0)
Hemoglobin: 16 g/dL (ref 13.0–17.0)
MCH: 31.5 pg (ref 26.0–34.0)
MCHC: 34.8 g/dL (ref 30.0–36.0)
MCV: 90.6 fL (ref 80.0–100.0)
Platelets: 372 10*3/uL (ref 150–400)
RBC: 5.08 MIL/uL (ref 4.22–5.81)
RDW: 13.5 % (ref 11.5–15.5)
WBC: 9.8 10*3/uL (ref 4.0–10.5)
nRBC: 0 % (ref 0.0–0.2)

## 2021-06-18 NOTE — Discharge Instructions (Addendum)
It was our pleasure to provide your ER care today - we hope that you feel better. ? ?Your lab tests and imaging tests look good/normal. ? ?Follow up with primary care doctor this coming week as planned.  ? ?For recent symptoms, also follow up with neurologist in the next 1-2 weeks - call office to arrange appointment. ? ?Take an enteric coated aspirin a day.  ? ?Return to ER if worse, new symptoms, fevers, new/severe pain, chest pain, trouble breathing, change in speech or vision, one-sided numbness/weakness, or other concern.  ?

## 2021-06-18 NOTE — ED Provider Notes (Signed)
?[facility] ?Provider Note ? ? ?CSN: [id number] ?Arrival date & time: 06/18/21  1520 ? ?  ? ?History ? ?Chief Complaint  ?Patient presents with  ? visual symptom  ? Numbness  ? ? ?Wesley Dunn is a 51 y.o. male. ? ?Patient indicates in past week or so some confusion - indicates speed of his thinking occasionally seems off, and gives example that at work he may have to concentrate on doing things that he's done for years.  Denies problems findings words or expressing himself. Denies slurred or dysarthric speech. States occasionally feels that his visual perception is not the same on both sides, indicating that it seems one side of vision may be processing a split second faster than the other side. Denies amaurosis or visual field cuts/deficit. Denies flashers, floaters, or black spots in visual field. States occasionally left hand feels numb/tingling, but denies currently. No one side of face or body weakness. No loss of sensation. No problems w balance or coordination. No falls. No headache. No neck pain or radicular pain. No headache. No eye pain. States symptoms variable duration, seconds to minutes.  ? ?The history is provided by the patient, medical records and a significant other.  ? ?  ? ?Home Medications ?Prior to Admission medications   ?Medication Sig Start Date End Date Taking? Authorizing Provider  ?aspirin EC 81 MG tablet Take 1 tablet (81 mg total) by mouth daily. 06/13/19   Jene EveryBeane, Jeffrey, MD  ?ibuprofen (ADVIL) 800 MG tablet Take 1 tablet (800 mg total) by mouth every 8 (eight) hours as needed (with meals). 06/13/19   Jene EveryBeane, Jeffrey, MD  ?losartan (COZAAR) 100 MG tablet Take 100 mg by mouth daily.  02/21/14   [provider]  ?oxyCODONE (OXY IR/ROXICODONE) 5 MG immediate release tablet Take 1-2 tablets (5-10 mg total) by mouth every 4 (four) hours as needed for severe pain. 06/13/19   Jene EveryBeane, Jeffrey, MD  ?   ? ?Allergies    ?Patient has no known allergies.   ? ?Review of  Systems   ?Review of Systems  ?Constitutional:  Negative for fever.  ?HENT:  Negative for sore throat.   ?Eyes:  Negative for pain and redness.  ?Respiratory:  Negative for shortness of breath.   ?Cardiovascular:  Negative for chest pain and palpitations.  ?Gastrointestinal:  Negative for abdominal pain and vomiting.  ?Genitourinary:  Negative for dysuria and flank pain.  ?Musculoskeletal:  Negative for back pain, neck pain and neck stiffness.  ?Skin:  Negative for rash.  ?Neurological:  Negative for syncope, speech difficulty and headaches.  ?Hematological:  Does not bruise/bleed easily.  ?Psychiatric/Behavioral:  Negative for agitation.   ? ?Physical Exam ?Updated Vital Signs ?BP (!) 165/94   Pulse 69   Temp (!) 97.5 ?F (36.4 ?C) (Oral)   Resp 17   Ht 1.778 m (5\' 10" )   Wt 79.4 kg   SpO2 98%   BMI 25.11 kg/m?  ?Physical Exam ?Vitals and nursing note reviewed.  ?Constitutional:   ?   Appearance: Normal appearance. He is well-developed.  ?HENT:  ?   Head: Atraumatic.  ?   Nose: Nose normal.  ?   Mouth/Throat:  ?   Mouth: Mucous membranes are moist.  ?   Pharynx: Oropharynx is clear.  ?Eyes:  ?   General: No scleral icterus. ?   Extraocular Movements: Extraocular movements intact.  ?   Conjunctiva/sclera: Conjunctivae normal.  ?   Pupils: Pupils are equal, round, and reactive  to light.  ?Neck:  ?   Vascular: No carotid bruit.  ?   Trachea: No tracheal deviation.  ?Cardiovascular:  ?   Rate and Rhythm: Normal rate and regular rhythm.  ?   Pulses: Normal pulses.  ?   Heart sounds: Normal heart sounds. No murmur heard. ?  No friction rub. No gallop.  ?Pulmonary:  ?   Effort: Pulmonary effort is normal. No accessory muscle usage or respiratory distress.  ?   Breath sounds: Normal breath sounds.  ?Abdominal:  ?   General: Bowel sounds are normal. There is no distension.  ?   Palpations: Abdomen is soft.  ?   Tenderness: There is no abdominal tenderness.  ?Genitourinary: ?   Comments: No cva  tenderness. ?Musculoskeletal:     ?   General: No swelling or tenderness.  ?   Cervical back: Normal range of motion and neck supple. No rigidity or tenderness.  ?   Right lower leg: No edema.  ?   Left lower leg: No edema.  ?   Comments: C spine non tender, aligned, normal rom.   ?Skin: ?   General: Skin is warm and dry.  ?   Findings: No rash.  ?Neurological:  ?   General: No focal deficit present.  ?   Mental Status: He is alert and oriented to person, place, and time.  ?   Comments: Alert, speech clear. No dysarthria or aphasia. Motor fxn grossly intact bil, stre 5/5. No pronator drift. Sensation grossly intact. Steady gait.   ?Psychiatric:     ?   Mood and Affect: Mood normal.  ? ? ?ED Results / Procedures / Treatments   ?Labs ?(all labs ordered are listed, but only abnormal results are displayed) ?Results for orders placed or performed during the hospital encounter of 06/18/21  ?CBC  ?Result Value Ref Range  ? WBC 9.8 4.0 - 10.5 K/uL  ? RBC 5.08 4.22 - 5.81 MIL/uL  ? Hemoglobin 16.0 13.0 - 17.0 g/dL  ? HCT 46.0 39.0 - 52.0 %  ? MCV 90.6 80.0 - 100.0 fL  ? MCH 31.5 26.0 - 34.0 pg  ? MCHC 34.8 30.0 - 36.0 g/dL  ? RDW 13.5 11.5 - 15.5 %  ? Platelets 372 150 - 400 K/uL  ? nRBC 0.0 0.0 - 0.2 %  ?Basic metabolic panel  ?Result Value Ref Range  ? Sodium 136 135 - 145 mmol/L  ? Potassium 4.2 3.5 - 5.1 mmol/L  ? Chloride 106 98 - 111 mmol/L  ? CO2 22 22 - 32 mmol/L  ? Glucose, Bld 98 70 - 99 mg/dL  ? BUN 18 6 - 20 mg/dL  ? Creatinine, Ser 1.07 0.61 - 1.24 mg/dL  ? Calcium 9.2 8.9 - 10.3 mg/dL  ? GFR, Estimated >60 >60 mL/min  ? Anion gap 8 5 - 15  ? ? ? ?EKG ?EKG Interpretation ? ?Date/Time:  Thursday June 18 2021 15:41:12 EDT ?Ventricular Rate:  64 ?PR Interval:  144 ?QRS Duration: 119 ?QT Interval:  390 ?QTC Calculation: 403 ?R Axis:   57 ?Text Interpretation: Sinus rhythm Incomplete right bundle branch block No significant change since last tracing Confirmed by Cathren Laine ([id number]) on 06/18/2021 5:24:27  PM ? ?Radiology ?CT Head Wo Contrast ? ?Result Date: 06/18/2021 ?CLINICAL DATA:  Altered mental status, vision change EXAM: CT HEAD WITHOUT CONTRAST TECHNIQUE: Contiguous axial images were obtained from the base of the skull through the vertex without intravenous contrast. RADIATION DOSE REDUCTION: This exam  was performed according to the departmental dose-optimization program which includes automated exposure control, adjustment of the mA and/or kV according to patient size and/or use of iterative reconstruction technique. COMPARISON:  None. FINDINGS: Brain: Normal anatomic configuration. No abnormal intra or extra-axial mass lesion or fluid collection. No abnormal mass effect or midline shift. No evidence of acute intracranial hemorrhage or infarct. Ventricular size is normal. Cerebellum unremarkable. Vascular: Unremarkable Skull: Intact Sinuses/Orbits: Paranasal sinuses are clear. Orbits are unremarkable. Other: Mastoid air cells and middle ear cavities are clear. IMPRESSION: No acute intracranial abnormality.  Normal examination. Electronically Signed   By: Helyn Numbers M.D.   On: 06/18/2021 17:30  ? ?MR BRAIN WO CONTRAST ? ?Result Date: 06/18/2021 ?CLINICAL DATA:  Acute neuro deficit. Rule out stroke. Vision problem. EXAM: MRI HEAD WITHOUT CONTRAST TECHNIQUE: Multiplanar, multiecho pulse sequences of the brain and surrounding structures were obtained without intravenous contrast. COMPARISON:  CT head 06/18/2021 FINDINGS: Brain: No acute infarction, hemorrhage, hydrocephalus, extra-axial collection or mass lesion. Normal white matter. Vascular: Normal arterial flow voids at the skull base. Skull and upper cervical spine: No focal skeletal abnormality. Sinuses/Orbits: Retention cyst right maxillary sinus. Negative orbit Other: None IMPRESSION: Negative MRI head without contrast. Electronically Signed   By: Marlan Palau M.D.   On: 06/18/2021 18:50   ? ?Procedures ?Procedures  ? ? ?Medications Ordered in  ED ?Medications - No data to display ? ?ED Course/ Medical Decision Making/ A&P ?  ?                        ?Medical Decision Making ?Problems Addressed: ?Essential hypertension: chronic illness or injury with exacerbation, progression, or side effects of

## 2021-06-18 NOTE — ED Notes (Signed)
Patient transported to MRI 

## 2021-06-18 NOTE — ED Triage Notes (Signed)
Thinks he may be having a stroke, states he has had vision problem since 0930-1000 today. Also has left hand numbness.  ?

## 2021-06-23 ENCOUNTER — Other Ambulatory Visit: Payer: Self-pay | Admitting: Family Medicine

## 2021-06-23 ENCOUNTER — Other Ambulatory Visit (HOSPITAL_COMMUNITY): Payer: Self-pay | Admitting: Family Medicine

## 2021-06-23 DIAGNOSIS — F1010 Alcohol abuse, uncomplicated: Secondary | ICD-10-CM | POA: Diagnosis not present

## 2021-06-23 DIAGNOSIS — Z6826 Body mass index (BMI) 26.0-26.9, adult: Secondary | ICD-10-CM | POA: Diagnosis not present

## 2021-06-23 DIAGNOSIS — R030 Elevated blood-pressure reading, without diagnosis of hypertension: Secondary | ICD-10-CM

## 2021-06-23 DIAGNOSIS — E663 Overweight: Secondary | ICD-10-CM | POA: Diagnosis not present

## 2021-06-23 DIAGNOSIS — I10 Essential (primary) hypertension: Secondary | ICD-10-CM | POA: Diagnosis not present

## 2021-06-25 ENCOUNTER — Other Ambulatory Visit (HOSPITAL_COMMUNITY): Payer: Self-pay | Admitting: Family Medicine

## 2021-06-25 DIAGNOSIS — I10 Essential (primary) hypertension: Secondary | ICD-10-CM

## 2021-06-25 DIAGNOSIS — R030 Elevated blood-pressure reading, without diagnosis of hypertension: Secondary | ICD-10-CM

## 2021-07-06 ENCOUNTER — Encounter (HOSPITAL_COMMUNITY): Payer: Self-pay

## 2021-07-06 ENCOUNTER — Inpatient Hospital Stay (HOSPITAL_COMMUNITY): Admission: RE | Admit: 2021-07-06 | Payer: BC Managed Care – PPO | Source: Ambulatory Visit

## 2021-07-06 ENCOUNTER — Other Ambulatory Visit (HOSPITAL_COMMUNITY): Payer: BC Managed Care – PPO

## 2021-07-07 ENCOUNTER — Ambulatory Visit (HOSPITAL_COMMUNITY)
Admission: RE | Admit: 2021-07-07 | Discharge: 2021-07-07 | Disposition: A | Discharge status: Home / Self Care | Payer: BC Managed Care – PPO | Source: Ambulatory Visit | Attending: Family Medicine | Admitting: Family Medicine

## 2021-07-07 ENCOUNTER — Ambulatory Visit (HOSPITAL_BASED_OUTPATIENT_CLINIC_OR_DEPARTMENT_OTHER)
Admission: RE | Admit: 2021-07-07 | Discharge: 2021-07-07 | Disposition: A | Discharge status: Home / Self Care | Payer: BC Managed Care – PPO | Source: Ambulatory Visit | Attending: Family Medicine | Admitting: Family Medicine

## 2021-07-07 DIAGNOSIS — I10 Essential (primary) hypertension: Secondary | ICD-10-CM

## 2021-07-07 DIAGNOSIS — R030 Elevated blood-pressure reading, without diagnosis of hypertension: Secondary | ICD-10-CM

## 2021-10-05 DIAGNOSIS — Z713 Dietary counseling and surveillance: Secondary | ICD-10-CM | POA: Diagnosis not present

## 2021-12-07 DIAGNOSIS — R030 Elevated blood-pressure reading, without diagnosis of hypertension: Secondary | ICD-10-CM | POA: Diagnosis not present

## 2021-12-07 DIAGNOSIS — I10 Essential (primary) hypertension: Secondary | ICD-10-CM | POA: Diagnosis not present

## 2021-12-07 DIAGNOSIS — J069 Acute upper respiratory infection, unspecified: Secondary | ICD-10-CM | POA: Diagnosis not present

## 2021-12-07 DIAGNOSIS — E663 Overweight: Secondary | ICD-10-CM | POA: Diagnosis not present

## 2021-12-07 DIAGNOSIS — Z6827 Body mass index (BMI) 27.0-27.9, adult: Secondary | ICD-10-CM | POA: Diagnosis not present

## 2022-01-11 DIAGNOSIS — Z713 Dietary counseling and surveillance: Secondary | ICD-10-CM | POA: Diagnosis not present

## 2022-03-24 DIAGNOSIS — S61412A Laceration without foreign body of left hand, initial encounter: Secondary | ICD-10-CM | POA: Diagnosis not present

## 2022-03-24 DIAGNOSIS — E663 Overweight: Secondary | ICD-10-CM | POA: Diagnosis not present

## 2022-03-24 DIAGNOSIS — Z6827 Body mass index (BMI) 27.0-27.9, adult: Secondary | ICD-10-CM | POA: Diagnosis not present

## 2022-03-24 DIAGNOSIS — Z23 Encounter for immunization: Secondary | ICD-10-CM | POA: Diagnosis not present

## 2022-05-21 DIAGNOSIS — S43421A Sprain of right rotator cuff capsule, initial encounter: Secondary | ICD-10-CM | POA: Diagnosis not present

## 2022-05-21 DIAGNOSIS — Z6827 Body mass index (BMI) 27.0-27.9, adult: Secondary | ICD-10-CM | POA: Diagnosis not present

## 2022-05-21 DIAGNOSIS — M25511 Pain in right shoulder: Secondary | ICD-10-CM | POA: Diagnosis not present

## 2022-05-21 DIAGNOSIS — E663 Overweight: Secondary | ICD-10-CM | POA: Diagnosis not present

## 2022-05-31 DIAGNOSIS — Z13220 Encounter for screening for lipoid disorders: Secondary | ICD-10-CM | POA: Diagnosis not present

## 2022-05-31 DIAGNOSIS — Z131 Encounter for screening for diabetes mellitus: Secondary | ICD-10-CM | POA: Diagnosis not present

## 2022-05-31 DIAGNOSIS — Z713 Dietary counseling and surveillance: Secondary | ICD-10-CM | POA: Diagnosis not present

## 2022-06-29 DIAGNOSIS — M25511 Pain in right shoulder: Secondary | ICD-10-CM | POA: Diagnosis not present

## 2022-06-29 DIAGNOSIS — S43421A Sprain of right rotator cuff capsule, initial encounter: Secondary | ICD-10-CM | POA: Diagnosis not present

## 2022-07-01 DIAGNOSIS — M25511 Pain in right shoulder: Secondary | ICD-10-CM | POA: Diagnosis not present

## 2022-07-09 DIAGNOSIS — M25511 Pain in right shoulder: Secondary | ICD-10-CM | POA: Diagnosis not present

## 2022-07-14 DIAGNOSIS — M25511 Pain in right shoulder: Secondary | ICD-10-CM | POA: Diagnosis not present

## 2022-12-08 DIAGNOSIS — E782 Mixed hyperlipidemia: Secondary | ICD-10-CM | POA: Diagnosis not present

## 2022-12-08 DIAGNOSIS — Z1331 Encounter for screening for depression: Secondary | ICD-10-CM | POA: Diagnosis not present

## 2022-12-08 DIAGNOSIS — R7309 Other abnormal glucose: Secondary | ICD-10-CM | POA: Diagnosis not present

## 2022-12-08 DIAGNOSIS — Z6826 Body mass index (BMI) 26.0-26.9, adult: Secondary | ICD-10-CM | POA: Diagnosis not present

## 2022-12-08 DIAGNOSIS — Z0001 Encounter for general adult medical examination with abnormal findings: Secondary | ICD-10-CM | POA: Diagnosis not present

## 2022-12-08 DIAGNOSIS — I10 Essential (primary) hypertension: Secondary | ICD-10-CM | POA: Diagnosis not present

## 2022-12-08 DIAGNOSIS — Z23 Encounter for immunization: Secondary | ICD-10-CM | POA: Diagnosis not present

## 2022-12-08 DIAGNOSIS — E663 Overweight: Secondary | ICD-10-CM | POA: Diagnosis not present

## 2022-12-08 DIAGNOSIS — E7849 Other hyperlipidemia: Secondary | ICD-10-CM | POA: Diagnosis not present

## 2023-01-25 DIAGNOSIS — Z1211 Encounter for screening for malignant neoplasm of colon: Secondary | ICD-10-CM | POA: Diagnosis not present

## 2023-01-25 DIAGNOSIS — D123 Benign neoplasm of transverse colon: Secondary | ICD-10-CM | POA: Diagnosis not present

## 2023-01-25 DIAGNOSIS — D122 Benign neoplasm of ascending colon: Secondary | ICD-10-CM | POA: Diagnosis not present

## 2023-01-25 DIAGNOSIS — D128 Benign neoplasm of rectum: Secondary | ICD-10-CM | POA: Diagnosis not present

## 2023-09-06 DIAGNOSIS — Z13220 Encounter for screening for lipoid disorders: Secondary | ICD-10-CM | POA: Diagnosis not present

## 2023-09-06 DIAGNOSIS — Z131 Encounter for screening for diabetes mellitus: Secondary | ICD-10-CM | POA: Diagnosis not present

## 2023-09-06 DIAGNOSIS — Z713 Dietary counseling and surveillance: Secondary | ICD-10-CM | POA: Diagnosis not present

## 2023-12-22 DIAGNOSIS — F17290 Nicotine dependence, other tobacco product, uncomplicated: Secondary | ICD-10-CM | POA: Diagnosis not present

## 2023-12-22 DIAGNOSIS — I10 Essential (primary) hypertension: Secondary | ICD-10-CM | POA: Diagnosis not present

## 2023-12-30 DIAGNOSIS — Z6825 Body mass index (BMI) 25.0-25.9, adult: Secondary | ICD-10-CM | POA: Diagnosis not present

## 2024-01-26 ENCOUNTER — Ambulatory Visit

## 2024-01-26 ENCOUNTER — Other Ambulatory Visit: Payer: Self-pay | Admitting: Family Medicine

## 2024-01-26 DIAGNOSIS — G4452 New daily persistent headache (NDPH): Secondary | ICD-10-CM

## 2024-01-26 DIAGNOSIS — R519 Headache, unspecified: Secondary | ICD-10-CM | POA: Diagnosis not present

## 2024-01-26 DIAGNOSIS — R410 Disorientation, unspecified: Secondary | ICD-10-CM | POA: Diagnosis not present
